# Patient Record
Sex: Female | Born: 1955 | Race: White | Hispanic: No | Marital: Married | Smoking: Former smoker
Health system: Southern US, Community
[De-identification: ages and names within clinical notes are randomized; demographics above are authoritative.]

## PROBLEM LIST (undated history)

## (undated) DIAGNOSIS — T7840XA Allergy, unspecified, initial encounter: Secondary | ICD-10-CM

## (undated) DIAGNOSIS — H269 Unspecified cataract: Secondary | ICD-10-CM

## (undated) DIAGNOSIS — H6990 Unspecified Eustachian tube disorder, unspecified ear: Secondary | ICD-10-CM

## (undated) DIAGNOSIS — H1010 Acute atopic conjunctivitis, unspecified eye: Secondary | ICD-10-CM

## (undated) DIAGNOSIS — H6980 Other specified disorders of Eustachian tube, unspecified ear: Secondary | ICD-10-CM

## (undated) HISTORY — DX: Other specified disorders of Eustachian tube, unspecified ear: H69.80

## (undated) HISTORY — PX: TONSILLECTOMY: SUR1361

## (undated) HISTORY — PX: POLYPECTOMY: SHX149

## (undated) HISTORY — PX: COLONOSCOPY: SHX174

## (undated) HISTORY — DX: Unspecified eustachian tube disorder, unspecified ear: H69.90

## (undated) HISTORY — PX: OTHER SURGICAL HISTORY: SHX169

## (undated) HISTORY — DX: Acute atopic conjunctivitis, unspecified eye: H10.10

## (undated) HISTORY — DX: Unspecified cataract: H26.9

## (undated) HISTORY — DX: Allergy, unspecified, initial encounter: T78.40XA

## (undated) HISTORY — PX: WISDOM TOOTH EXTRACTION: SHX21

## (undated) HISTORY — PX: BARTHOLIN GLAND CYST EXCISION: SHX565

---

## 1999-01-09 ENCOUNTER — Other Ambulatory Visit: Admission: RE | Admit: 1999-01-09 | Discharge: 1999-01-09 | Payer: Self-pay | Admitting: Obstetrics and Gynecology

## 2001-07-01 ENCOUNTER — Other Ambulatory Visit: Admission: RE | Admit: 2001-07-01 | Discharge: 2001-07-01 | Payer: Self-pay | Admitting: Obstetrics and Gynecology

## 2001-07-08 ENCOUNTER — Ambulatory Visit (HOSPITAL_COMMUNITY): Admission: RE | Admit: 2001-07-08 | Discharge: 2001-07-08 | Payer: Self-pay | Admitting: Obstetrics and Gynecology

## 2001-07-08 ENCOUNTER — Encounter: Payer: Self-pay | Admitting: Obstetrics and Gynecology

## 2002-07-04 ENCOUNTER — Other Ambulatory Visit: Admission: RE | Admit: 2002-07-04 | Discharge: 2002-07-04 | Payer: Self-pay | Admitting: Obstetrics and Gynecology

## 2002-08-04 ENCOUNTER — Encounter: Payer: Self-pay | Admitting: Obstetrics and Gynecology

## 2002-08-04 ENCOUNTER — Ambulatory Visit (HOSPITAL_COMMUNITY): Admission: RE | Admit: 2002-08-04 | Discharge: 2002-08-04 | Payer: Self-pay | Admitting: Obstetrics and Gynecology

## 2003-08-07 ENCOUNTER — Other Ambulatory Visit: Admission: RE | Admit: 2003-08-07 | Discharge: 2003-08-07 | Payer: Self-pay | Admitting: Obstetrics and Gynecology

## 2004-09-05 ENCOUNTER — Other Ambulatory Visit: Admission: RE | Admit: 2004-09-05 | Discharge: 2004-09-05 | Payer: Self-pay | Admitting: Obstetrics and Gynecology

## 2004-11-28 ENCOUNTER — Ambulatory Visit (HOSPITAL_COMMUNITY): Admission: RE | Admit: 2004-11-28 | Discharge: 2004-11-28 | Payer: Self-pay | Admitting: Obstetrics and Gynecology

## 2005-12-09 ENCOUNTER — Other Ambulatory Visit: Admission: RE | Admit: 2005-12-09 | Discharge: 2005-12-09 | Payer: Self-pay | Admitting: Obstetrics and Gynecology

## 2006-12-22 ENCOUNTER — Other Ambulatory Visit: Admission: RE | Admit: 2006-12-22 | Discharge: 2006-12-22 | Payer: Self-pay | Admitting: Obstetrics and Gynecology

## 2006-12-24 ENCOUNTER — Ambulatory Visit (HOSPITAL_COMMUNITY): Admission: RE | Admit: 2006-12-24 | Discharge: 2006-12-24 | Payer: Self-pay | Admitting: Obstetrics and Gynecology

## 2007-07-06 ENCOUNTER — Ambulatory Visit: Payer: Self-pay | Admitting: Family Medicine

## 2007-07-06 DIAGNOSIS — H6990 Unspecified Eustachian tube disorder, unspecified ear: Secondary | ICD-10-CM | POA: Insufficient documentation

## 2007-07-06 DIAGNOSIS — H6980 Other specified disorders of Eustachian tube, unspecified ear: Secondary | ICD-10-CM | POA: Insufficient documentation

## 2007-07-06 DIAGNOSIS — J309 Allergic rhinitis, unspecified: Secondary | ICD-10-CM | POA: Insufficient documentation

## 2007-08-10 ENCOUNTER — Telehealth (INDEPENDENT_AMBULATORY_CARE_PROVIDER_SITE_OTHER): Payer: Self-pay | Admitting: *Deleted

## 2007-08-23 ENCOUNTER — Telehealth (INDEPENDENT_AMBULATORY_CARE_PROVIDER_SITE_OTHER): Payer: Self-pay | Admitting: Family Medicine

## 2007-10-06 ENCOUNTER — Telehealth (INDEPENDENT_AMBULATORY_CARE_PROVIDER_SITE_OTHER): Payer: Self-pay | Admitting: *Deleted

## 2007-10-07 ENCOUNTER — Ambulatory Visit: Payer: Self-pay | Admitting: Family Medicine

## 2007-10-08 ENCOUNTER — Telehealth (INDEPENDENT_AMBULATORY_CARE_PROVIDER_SITE_OTHER): Payer: Self-pay | Admitting: Family Medicine

## 2007-12-24 ENCOUNTER — Ambulatory Visit: Payer: Self-pay | Admitting: Internal Medicine

## 2007-12-24 DIAGNOSIS — L2089 Other atopic dermatitis: Secondary | ICD-10-CM | POA: Insufficient documentation

## 2008-03-16 ENCOUNTER — Other Ambulatory Visit: Admission: RE | Admit: 2008-03-16 | Discharge: 2008-03-16 | Payer: Self-pay | Admitting: Obstetrics and Gynecology

## 2008-03-20 ENCOUNTER — Telehealth (INDEPENDENT_AMBULATORY_CARE_PROVIDER_SITE_OTHER): Payer: Self-pay | Admitting: *Deleted

## 2008-04-18 ENCOUNTER — Ambulatory Visit: Payer: Self-pay | Admitting: Internal Medicine

## 2008-04-28 ENCOUNTER — Ambulatory Visit (HOSPITAL_COMMUNITY): Admission: RE | Admit: 2008-04-28 | Discharge: 2008-04-28 | Payer: Self-pay | Admitting: Obstetrics and Gynecology

## 2008-05-19 ENCOUNTER — Ambulatory Visit: Payer: Self-pay | Admitting: Internal Medicine

## 2008-09-11 ENCOUNTER — Ambulatory Visit: Payer: Self-pay | Admitting: Family Medicine

## 2008-09-11 DIAGNOSIS — J069 Acute upper respiratory infection, unspecified: Secondary | ICD-10-CM | POA: Insufficient documentation

## 2008-12-06 ENCOUNTER — Ambulatory Visit: Payer: Self-pay | Admitting: Family Medicine

## 2009-03-19 ENCOUNTER — Other Ambulatory Visit: Admission: RE | Admit: 2009-03-19 | Discharge: 2009-03-19 | Payer: Self-pay | Admitting: Obstetrics and Gynecology

## 2009-05-10 ENCOUNTER — Ambulatory Visit (HOSPITAL_COMMUNITY): Admission: RE | Admit: 2009-05-10 | Discharge: 2009-05-10 | Payer: Self-pay | Admitting: Obstetrics and Gynecology

## 2009-06-06 ENCOUNTER — Telehealth (INDEPENDENT_AMBULATORY_CARE_PROVIDER_SITE_OTHER): Payer: Self-pay | Admitting: *Deleted

## 2009-06-06 ENCOUNTER — Ambulatory Visit: Payer: Self-pay | Admitting: Family Medicine

## 2009-06-06 DIAGNOSIS — H1045 Other chronic allergic conjunctivitis: Secondary | ICD-10-CM | POA: Insufficient documentation

## 2009-06-12 ENCOUNTER — Telehealth (INDEPENDENT_AMBULATORY_CARE_PROVIDER_SITE_OTHER): Payer: Self-pay | Admitting: *Deleted

## 2009-06-13 ENCOUNTER — Telehealth (INDEPENDENT_AMBULATORY_CARE_PROVIDER_SITE_OTHER): Payer: Self-pay | Admitting: *Deleted

## 2009-06-18 ENCOUNTER — Telehealth (INDEPENDENT_AMBULATORY_CARE_PROVIDER_SITE_OTHER): Payer: Self-pay | Admitting: *Deleted

## 2009-07-06 ENCOUNTER — Ambulatory Visit: Payer: Self-pay | Admitting: Internal Medicine

## 2009-07-06 LAB — CONVERTED CEMR LAB
Anti Nuclear Antibody(ANA): NEGATIVE
Basophils Relative: 0.9 % (ref 0.0–3.0)
Eosinophils Absolute: 0.1 10*3/uL (ref 0.0–0.7)
Hemoglobin: 14.1 g/dL (ref 12.0–15.0)
Lymphocytes Relative: 24.2 % (ref 12.0–46.0)
MCHC: 33.8 g/dL (ref 30.0–36.0)
Neutro Abs: 4.6 10*3/uL (ref 1.4–7.7)
RBC: 4.39 M/uL (ref 3.87–5.11)

## 2009-08-01 ENCOUNTER — Ambulatory Visit: Payer: Self-pay | Admitting: Internal Medicine

## 2010-01-30 ENCOUNTER — Encounter (INDEPENDENT_AMBULATORY_CARE_PROVIDER_SITE_OTHER): Payer: Self-pay | Admitting: *Deleted

## 2010-01-30 ENCOUNTER — Ambulatory Visit: Payer: Self-pay | Admitting: Family Medicine

## 2010-05-03 ENCOUNTER — Other Ambulatory Visit: Admission: RE | Admit: 2010-05-03 | Discharge: 2010-05-03 | Payer: Self-pay | Admitting: Obstetrics and Gynecology

## 2010-06-12 ENCOUNTER — Ambulatory Visit (HOSPITAL_COMMUNITY): Admission: RE | Admit: 2010-06-12 | Discharge: 2010-06-12 | Payer: Self-pay | Admitting: Obstetrics and Gynecology

## 2010-07-04 ENCOUNTER — Telehealth: Payer: Self-pay | Admitting: Internal Medicine

## 2010-10-20 ENCOUNTER — Encounter: Payer: Self-pay | Admitting: Obstetrics and Gynecology

## 2010-10-29 NOTE — Assessment & Plan Note (Signed)
Summary: eyes matted crusted,draining/alr   Vital Signs:  Patient profile:   55 year old female Weight:      149.4 pounds Temp:     98.5 degrees F o1 BP sitting:   120 / 76  (left arm)  Vitals Entered By: Doristine Devoid (Jan 30, 2010 2:23 PM) CC: both eyes itchy and redness along w/ drainage   History of Present Illness: 55 yo woman here today w/ itchy, red eyes.  had seen Allergist and was told that she is not allergic to anything.  sxs resolved over the winter but again appeared w/ the change in season.  sxs started again 5-6 weeks ago.  using Aquaphor around eyes.  using Cromolyn eye drops 4x/day and Visine allergy w/out relief.  eye drainage is thin and watery.  eyes feel gritty.  using Nasonex and Allegra.  Allergies: No Known Drug Allergies  Review of Systems      See HPI  Physical Exam  General:  alert and well-developed.   Eyes:  + conjunctival injxn and inflammation, clear watery discharge. Nose:  Mildly swollen turbinates with clear nasal discharge Mouth:  Oral mucosa and oropharynx without lesions or exudates.  Teeth in good repair.   Impression & Recommendations:  Problem # 1:  CONJUNCTIVITIS, ALLERGIC (ICD-372.14) Assessment Unchanged despite pt's (-) allergy testing pt's sxs recurred w/ change in season.  start Pataday and refer to Ophthalmology.  continue the Cromolyn, nasal steroids, Allegra.  will follow. Orders: Ophthalmology Referral (Ophthalmology)  Complete Medication List: 1)  Allegra 180 Mg Tabs (Fexofenadine hcl) .... Take one tablet daily 2)  Nasonex 50 Mcg/act Susp (Mometasone furoate) .... 2 sprays each nostril  once daily 3)  Pataday 0.2 % Soln (Olopatadine hcl) .Marland Kitchen.. 1 drop each eye daily.  1 bottle  Patient Instructions: 1)  Someone will call you with your eye appt 2)  Use the Pataday as directed 3)  Continue the Cromolyn drops 4)  Ice your eyes using an eye gel mask 5)  Call with any questions or concerns 6)  Hang in  there!! Prescriptions: PATADAY 0.2 % SOLN (OLOPATADINE HCL) 1 drop each eye daily.  1 bottle  #1 x 3   Entered and Authorized by:   Neena Rhymes MD   Signed by:   Neena Rhymes MD on 01/30/2010   Method used:   Electronically to        [facility] [address] [id number]* (retail)       [address]       [place], [place]  [address]       Ph: [phone]       Fax: [phone]   RxID:   [id number]

## 2010-10-29 NOTE — Progress Notes (Signed)
Summary: refill  Phone Note Refill Request Message from:  Fax from Pharmacy on July 04, 2010 8:21 AM  Refills Requested: Medication #1:  PATADAY 0.2 % SOLN 1 drop each eye daily.  1 bottle. [facility] [address] - fax [phone]  Initial call taken by: Okey Regal [place],  July 04, 2010 8:22 AM  Follow-up for Phone Call        ok for 1 vial, 3 refills Follow-up by: Neena Rhymes MD,  July 04, 2010 9:10 AM    Prescriptions: PATADAY 0.2 % SOLN (OLOPATADINE HCL) 1 drop each eye daily.  1 bottle  #1 x 3   Entered by:   Lucious Groves CMA   Authorized by:   Nolon Rod. Kaitrin Seybold MD   Signed by:   Lucious Groves CMA on 07/04/2010   Method used:   Electronically to        [id number]* (retail)       [address]       [place], [place]  [address]       Ph: [phone]       Fax: [phone]   RxID:   [id number]

## 2010-10-29 NOTE — Letter (Signed)
Summary: [facility]  [address] [place], [place] [address]   Phone: [phone]  Fax: [phone]    To: Dagoberto Reef [facility] Court  From: Magdalen Spatz [facility]  01/30/2010  Patient's Name: Felicia Ball Date of Birth: 1956/01/15   The above patient is physically or mentally unable to perform jury duty.

## 2011-01-14 ENCOUNTER — Telehealth: Payer: Self-pay | Admitting: Family Medicine

## 2011-01-14 NOTE — Telephone Encounter (Signed)
Patient needs nose spray called into [facility] on corner of [address], [place]---says Nasonex costs $55.00 so she would like Flonase to be called in

## 2011-01-15 MED ORDER — FLUTICASONE PROPIONATE 50 MCG/ACT NA SUSP: 2.0000 | Freq: Every day | NASAL | Status: DC

## 2011-01-15 NOTE — Telephone Encounter (Signed)
Left message on voicemail to call the office and schedule CPX, refill being sent.

## 2011-01-15 NOTE — Telephone Encounter (Signed)
Per EMR records (mrn [id number]) it looks as if this pt transferred to [facility]. Last seen 5/11. Please advise.

## 2011-01-15 NOTE — Telephone Encounter (Signed)
Ok for [employer]...will need yearly CPE scheduled if it's coming due

## 2011-02-18 ENCOUNTER — Other Ambulatory Visit: Payer: Self-pay | Admitting: *Deleted

## 2011-02-18 MED ORDER — OLOPATADINE HCL 0.2 % OP SOLN: 1.0000 [drp] | Freq: Every day | OPHTHALMIC | Status: DC

## 2011-02-18 NOTE — Telephone Encounter (Deleted)
PATADAY 0.2 % SOLN Last filled 07-04-10 #1 3,last 01-30-10

## 2011-02-19 NOTE — Telephone Encounter (Signed)
Med refilled.

## 2011-03-17 ENCOUNTER — Encounter: Payer: Self-pay | Admitting: Family Medicine

## 2011-03-18 ENCOUNTER — Encounter: Payer: Self-pay | Admitting: Family Medicine

## 2011-03-26 ENCOUNTER — Encounter: Payer: Self-pay | Admitting: Family Medicine

## 2011-03-27 ENCOUNTER — Encounter: Payer: Self-pay | Admitting: Internal Medicine

## 2011-03-27 ENCOUNTER — Other Ambulatory Visit (HOSPITAL_COMMUNITY)
Admission: RE | Admit: 2011-03-27 | Discharge: 2011-03-27 | Disposition: A | Discharge status: Home / Self Care | Payer: BC Managed Care – PPO | Source: Ambulatory Visit | Attending: Family Medicine | Admitting: Family Medicine

## 2011-03-27 ENCOUNTER — Encounter: Payer: Self-pay | Admitting: Family Medicine

## 2011-03-27 ENCOUNTER — Ambulatory Visit (INDEPENDENT_AMBULATORY_CARE_PROVIDER_SITE_OTHER): Payer: BC Managed Care – PPO | Admitting: Family Medicine

## 2011-03-27 DIAGNOSIS — Z0000 Encounter for general adult medical examination without abnormal findings: Secondary | ICD-10-CM | POA: Insufficient documentation

## 2011-03-27 DIAGNOSIS — Z01419 Encounter for gynecological examination (general) (routine) without abnormal findings: Secondary | ICD-10-CM

## 2011-03-27 DIAGNOSIS — Z124 Encounter for screening for malignant neoplasm of cervix: Secondary | ICD-10-CM | POA: Insufficient documentation

## 2011-03-27 DIAGNOSIS — Z1211 Encounter for screening for malignant neoplasm of colon: Secondary | ICD-10-CM

## 2011-03-27 LAB — CBC WITH DIFFERENTIAL/PLATELET
Basophils Relative: 0.7 % (ref 0.0–3.0)
Eosinophils Relative: 1.6 % (ref 0.0–5.0)
HCT: 41 % (ref 36.0–46.0)
Hemoglobin: 13.8 g/dL (ref 12.0–15.0)
Lymphs Abs: 1.3 10*3/uL (ref 0.7–4.0)
Monocytes Relative: 5.9 % (ref 3.0–12.0)
Neutro Abs: 5.2 10*3/uL (ref 1.4–7.7)
WBC: 7.1 10*3/uL (ref 4.5–10.5)

## 2011-03-27 LAB — HEPATIC FUNCTION PANEL
Albumin: 4.3 g/dL (ref 3.5–5.2)
Total Protein: 7.1 g/dL (ref 6.0–8.3)

## 2011-03-27 LAB — LIPID PANEL
Cholesterol: 224 mg/dL — ABNORMAL HIGH (ref 0–200)
Triglycerides: 59 mg/dL (ref 0.0–149.0)

## 2011-03-27 LAB — BASIC METABOLIC PANEL
CO2: 25 mEq/L (ref 19–32)
Calcium: 9.1 mg/dL (ref 8.4–10.5)
Creatinine, Ser: 0.8 mg/dL (ref 0.4–1.2)
Glucose, Bld: 85 mg/dL (ref 70–99)

## 2011-03-27 LAB — LDL CHOLESTEROL, DIRECT: Direct LDL: 115.4 mg/dL

## 2011-03-27 NOTE — Assessment & Plan Note (Signed)
Pap collected. 

## 2011-03-27 NOTE — Patient Instructions (Signed)
Follow up in 1 year or as needed Keep up the good work!  Your exam looks great! We'll notify you of your lab results Call with any questions or concerns Have a great summer!

## 2011-03-27 NOTE — Progress Notes (Signed)
  Subjective:    Patient ID: Felicia Ball, female    DOB: Mar 13, 1956, 55 y.o.   MRN: [id number]  HPI CPE- no concerns today.  UTD on mammogram.  Has never had colonoscopy.   Review of Systems Patient reports no vision/ hearing changes, adenopathy,fever, weight change,  persistant/recurrent hoarseness , swallowing issues, chest pain, palpitations, edema, persistant/recurrent cough, hemoptysis, dyspnea (rest/exertional/paroxysmal nocturnal), gastrointestinal bleeding (melena, rectal bleeding), abdominal pain, significant heartburn, bowel changes, GU symptoms (dysuria, hematuria, incontinence), Gyn symptoms (abnormal  bleeding, pain),  syncope, focal weakness, memory loss, numbness & tingling, skin/hair/nail changes, abnormal bruising or bleeding, anxiety, or depression.     Objective:   Physical Exam  General Appearance:    Alert, cooperative, no distress, appears stated age  Head:    Normocephalic, without obvious abnormality, atraumatic  Eyes:    PERRL, conjunctiva/corneas clear, EOM's intact, fundi    benign, both eyes  Ears:    Normal TM's and external ear canals, both ears  Nose:   Nares normal, septum midline, mucosa normal, no drainage    or sinus tenderness  Throat:   Lips, mucosa, and tongue normal; teeth and gums normal  Neck:   Supple, symmetrical, trachea midline, no adenopathy;    Thyroid: no enlargement/tenderness/nodules  Back:     Symmetric, no curvature, ROM normal, no CVA tenderness  Lungs:     Clear to auscultation bilaterally, respirations unlabored  Chest Wall:    No tenderness or deformity   Heart:    Regular rate and rhythm, S1 and S2 normal, no murmur, rub   or gallop  Breast Exam:    No tenderness, masses, or nipple abnormality  Abdomen:     Soft, non-tender, bowel sounds active all four quadrants,    no masses, no organomegaly  Genitalia:    External genitalia normal, cervix normal in appearance, no CMT, uterus in normal size and position, adnexa w/out  mass or tenderness, mucosa pink and moist, no lesions or discharge present  Rectal:    Normal external appearance  Extremities:   Extremities normal, atraumatic, no cyanosis or edema  Pulses:   2+ and symmetric all extremities  Skin:   Skin color, texture, turgor normal, no rashes or lesions  Lymph nodes:   Cervical, supraclavicular, and axillary nodes normal  Neurologic:   CNII-XII intact, normal strength, sensation and reflexes    throughout          Assessment & Plan:

## 2011-03-27 NOTE — Assessment & Plan Note (Signed)
Pt's PE WNL.  EKG as baseline.  Refer for colonoscopy.  UTD on mammo.  Anticipatory guidance provided.

## 2011-03-28 ENCOUNTER — Encounter: Payer: Self-pay | Admitting: *Deleted

## 2011-03-29 LAB — VITAMIN D 1,25 DIHYDROXY
Vitamin D 1, 25 (OH)2 Total: 59 pg/mL (ref 18–72)
Vitamin D2 1, 25 (OH)2: <8 pg/mL

## 2011-03-31 ENCOUNTER — Encounter: Payer: Self-pay | Admitting: *Deleted

## 2011-04-01 ENCOUNTER — Other Ambulatory Visit: Payer: Self-pay | Admitting: Internal Medicine

## 2011-04-01 NOTE — Telephone Encounter (Signed)
Please send to Dr Beverely Low

## 2011-04-01 NOTE — Telephone Encounter (Signed)
Refill sent.

## 2011-04-23 ENCOUNTER — Other Ambulatory Visit: Payer: BC Managed Care – PPO | Admitting: Internal Medicine

## 2011-04-28 ENCOUNTER — Other Ambulatory Visit: Payer: Self-pay | Admitting: Internal Medicine

## 2011-04-28 MED ORDER — FLUTICASONE PROPIONATE 50 MCG/ACT NA SUSP: 2.0000 | Freq: Every day | NASAL | Status: DC

## 2011-04-28 NOTE — Telephone Encounter (Signed)
Ok to refill x 3 

## 2011-07-02 ENCOUNTER — Other Ambulatory Visit: Payer: Self-pay | Admitting: Family Medicine

## 2011-07-02 DIAGNOSIS — Z1231 Encounter for screening mammogram for malignant neoplasm of breast: Secondary | ICD-10-CM

## 2011-07-14 ENCOUNTER — Ambulatory Visit (HOSPITAL_COMMUNITY): Payer: BC Managed Care – PPO

## 2011-07-29 ENCOUNTER — Ambulatory Visit (HOSPITAL_COMMUNITY)
Admission: RE | Admit: 2011-07-29 | Discharge: 2011-07-29 | Disposition: A | Discharge status: Home / Self Care | Payer: BC Managed Care – PPO | Source: Ambulatory Visit | Attending: Family Medicine | Admitting: Family Medicine

## 2011-07-29 DIAGNOSIS — Z1231 Encounter for screening mammogram for malignant neoplasm of breast: Secondary | ICD-10-CM | POA: Insufficient documentation

## 2011-08-11 ENCOUNTER — Other Ambulatory Visit: Payer: Self-pay | Admitting: Internal Medicine

## 2011-08-11 MED ORDER — NORETHIN ACE-ETH ESTRAD-FE 1-20 MG-MCG PO TABS: 1.0000 | ORAL_TABLET | Freq: Every day | ORAL | Status: DC

## 2011-08-11 NOTE — Telephone Encounter (Signed)
rx sent to pharmacy by e-script  

## 2011-09-01 ENCOUNTER — Encounter: Payer: Self-pay | Admitting: Family Medicine

## 2011-09-01 ENCOUNTER — Ambulatory Visit (INDEPENDENT_AMBULATORY_CARE_PROVIDER_SITE_OTHER): Payer: BC Managed Care – PPO | Admitting: Family Medicine

## 2011-09-01 VITALS — BP 115/65 | HR 80 | Temp 98.0°F | Ht 62.0 in | Wt 155.8 lb

## 2011-09-01 DIAGNOSIS — J329 Chronic sinusitis, unspecified: Secondary | ICD-10-CM | POA: Insufficient documentation

## 2011-09-01 MED ORDER — CLARITHROMYCIN ER 500 MG PO TB24: 1000.0000 mg | ORAL_TABLET | Freq: Every day | ORAL | Status: DC

## 2011-09-01 NOTE — Patient Instructions (Signed)
Take the Biaxin as directed for the sinus infection 2 tabs at the same time, w/ food Drink plenty of fluids Mucinex to thin your congestion Hang in there! Happy Holidays!

## 2011-09-01 NOTE — Progress Notes (Signed)
  Subjective:    Patient ID: Felicia Ball, female    DOB: 28-Feb-1956, 55 y.o.   MRN: [id number]  HPI ? Sinus infxn- sxs started as a 'bad chest cold' 1 month ago.  Hasn't felt right since.  Having green, bloody nasal drainage.  + facial pain.  Ears will pop and 'crack'.  No fevers.  Minimal cough.  + sick contacts.   Review of Systems For ROS see HPI     Objective:   Physical Exam  Constitutional: She appears well-developed and well-nourished. No distress.  HENT:  Head: Normocephalic and atraumatic.  Right Ear: Tympanic membrane normal.  Left Ear: Tympanic membrane normal.  Nose: Mucosal edema and rhinorrhea present. Right sinus exhibits maxillary sinus tenderness and frontal sinus tenderness. Left sinus exhibits maxillary sinus tenderness and frontal sinus tenderness.  Mouth/Throat: Uvula is midline and mucous membranes are normal. Posterior oropharyngeal erythema present. No oropharyngeal exudate.  Eyes: Conjunctivae and EOM are normal. Pupils are equal, round, and reactive to light.  Neck: Normal range of motion. Neck supple.  Cardiovascular: Normal rate, regular rhythm and normal heart sounds.   Pulmonary/Chest: Effort normal and breath sounds normal. No respiratory distress. She has no wheezes.  Lymphadenopathy:    She has no cervical adenopathy.          Assessment & Plan:

## 2011-09-01 NOTE — Assessment & Plan Note (Signed)
Pt's hx and PE consistent w/ infxn.  Start abx.  Reviewed supportive care and red flags that should prompt return.  Pt expressed understanding and is in agreement w/ plan.  

## 2011-11-04 ENCOUNTER — Other Ambulatory Visit: Payer: Self-pay | Admitting: Internal Medicine

## 2011-11-04 MED ORDER — FLUTICASONE PROPIONATE 50 MCG/ACT NA SUSP: 2.0000 | Freq: Every day | NASAL | Status: DC

## 2011-11-04 NOTE — Telephone Encounter (Signed)
Will send to PCP 

## 2011-11-04 NOTE — Telephone Encounter (Signed)
rx sent to pharmacy by e-script  

## 2011-11-04 NOTE — Telephone Encounter (Signed)
Ok for 12 months of refills

## 2011-11-04 NOTE — Telephone Encounter (Signed)
Refill request fluticasone . Pt has not been seen within a year. OK to refill?

## 2011-12-24 ENCOUNTER — Ambulatory Visit (INDEPENDENT_AMBULATORY_CARE_PROVIDER_SITE_OTHER): Payer: BC Managed Care – PPO | Admitting: Internal Medicine

## 2011-12-24 ENCOUNTER — Encounter: Payer: Self-pay | Admitting: Internal Medicine

## 2011-12-24 VITALS — BP 118/80 | HR 76 | Temp 98.4°F | Wt 149.8 lb

## 2011-12-24 DIAGNOSIS — J302 Other seasonal allergic rhinitis: Secondary | ICD-10-CM

## 2011-12-24 DIAGNOSIS — J0100 Acute maxillary sinusitis, unspecified: Secondary | ICD-10-CM

## 2011-12-24 DIAGNOSIS — J309 Allergic rhinitis, unspecified: Secondary | ICD-10-CM

## 2011-12-24 MED ORDER — CEFUROXIME AXETIL 500 MG PO TABS: 500.0000 mg | ORAL_TABLET | Freq: Two times a day (BID) | ORAL | Status: AC

## 2011-12-24 MED ORDER — AZELASTINE-FLUTICASONE 137-50 MCG/ACT NA SUSP: 1.0000 | Freq: Two times a day (BID) | NASAL | Status: DC

## 2011-12-24 NOTE — Patient Instructions (Signed)
Plain Mucinex for thick secretions ;force NON dairy fluids . Use a Neti pot daily as needed for sinus congestion. Nasal cleansing in the shower as discussed. Make sure that all residual soap is removed to prevent irritation. Dymista  1 spray in each nostril twice a day as needed. Use the "crossover" technique as discussed

## 2011-12-24 NOTE — Progress Notes (Signed)
  Subjective:    Patient ID: Felicia Ball, female    DOB: 1956-03-24, 56 y.o.   MRN: [id number]  HPI Respiratory tract infection Onset/symptoms:2 weeks ago as allergy flare Exposures (illness/environmental/extrinsic):tres budding Progression of symptoms:to head congestion Treatments/response:Neti pot, allergy meds, Sudafed Present symptoms: Fever/chills/sweats:chills Frontal headache:no Facial pain:yes Nasal purulence: yes Sore throat:no Dental pain:no Lymphadenopathy: no Wheezing/shortness of breath:some DOE Cough/sputum/hemoptysis:dry  Associated extrinsic/allergic symptoms:itchy eyes/ sneezing:yes Past medical history: Seasonal allergies: yes/asthma:no Smoking history:quit 1984           Review of Systems     Objective:   Physical Exam General appearance:good health ;well nourished; no acute distress or increased work of breathing is present.  No  lymphadenopathy about the head, neck, or axilla noted.   Eyes: No conjunctival inflammation or lid edema is present. There is no scleral icterus.  Ears:  External ear exam shows no significant lesions or deformities.  Otoscopic examination reveals clear canals, tympanic membranes are intact bilaterally without bulging, retraction, inflammation or discharge.  Nose:  External nasal examination shows no deformity or inflammation. Nasal mucosa are dry  without lesions or exudates. Slight R  septal  Deviation.Minimal  obstruction to airflow.   Oral exam: Dental hygiene is good; lips and gums are healthy appearing.There is no oropharyngeal erythema or exudate noted.      Heart:  Normal rate and regular rhythm. S1 and S2 normal without gallop, murmur, click, rub or other extra sounds.   Lungs:Chest clear to auscultation; no wheezes, rhonchi,rales ,or rubs present.No increased work of breathing.    Extremities:  No cyanosis, edema, or clubbing  noted    Skin: Warm & dry .          Assessment & Plan:  #1  rhinosinusitis, maxillary  #2 seasonal rhinoconjunctivitis  Plan: See orders and recommendations

## 2012-01-20 ENCOUNTER — Telehealth: Payer: Self-pay | Admitting: *Deleted

## 2012-01-20 NOTE — Telephone Encounter (Signed)
Prior Auth approved 12-21-11 until 01-19-13, Pharmacy notified via fax.Awaiting approval letter to scan to chart.

## 2012-03-10 ENCOUNTER — Other Ambulatory Visit: Payer: Self-pay | Admitting: Internal Medicine

## 2012-03-10 NOTE — Telephone Encounter (Signed)
Pt has not been seen by you within a year. OK to refill? 

## 2012-03-11 NOTE — Telephone Encounter (Signed)
Per Dr Alwyn Ren

## 2012-03-15 ENCOUNTER — Telehealth: Payer: Self-pay | Admitting: Internal Medicine

## 2012-03-15 NOTE — Telephone Encounter (Signed)
Fine w/me-

## 2012-03-15 NOTE — Telephone Encounter (Signed)
Yes please

## 2012-03-15 NOTE — Telephone Encounter (Signed)
Patient states she always want to see Beverely Low, most history is wt/[facility], if ok I can change PCP, please review and advise

## 2012-05-14 ENCOUNTER — Encounter: Payer: BC Managed Care – PPO | Admitting: Family Medicine

## 2012-05-31 ENCOUNTER — Other Ambulatory Visit: Payer: Self-pay | Admitting: Internal Medicine

## 2012-06-01 NOTE — Telephone Encounter (Signed)
rx sent to pharmacy by e-script  

## 2012-06-09 ENCOUNTER — Ambulatory Visit (INDEPENDENT_AMBULATORY_CARE_PROVIDER_SITE_OTHER): Payer: BC Managed Care – PPO | Admitting: Family Medicine

## 2012-06-09 ENCOUNTER — Other Ambulatory Visit (HOSPITAL_COMMUNITY)
Admission: RE | Admit: 2012-06-09 | Discharge: 2012-06-09 | Disposition: A | Discharge status: Home / Self Care | Payer: BC Managed Care – PPO | Source: Ambulatory Visit | Attending: Family Medicine | Admitting: Family Medicine

## 2012-06-09 ENCOUNTER — Encounter: Payer: Self-pay | Admitting: Family Medicine

## 2012-06-09 VITALS — BP 122/80 | HR 98 | Temp 98.4°F | Ht 61.25 in | Wt 145.4 lb

## 2012-06-09 DIAGNOSIS — Z01419 Encounter for gynecological examination (general) (routine) without abnormal findings: Secondary | ICD-10-CM | POA: Insufficient documentation

## 2012-06-09 DIAGNOSIS — Z13220 Encounter for screening for lipoid disorders: Secondary | ICD-10-CM

## 2012-06-09 DIAGNOSIS — Z0000 Encounter for general adult medical examination without abnormal findings: Secondary | ICD-10-CM

## 2012-06-09 DIAGNOSIS — Z1211 Encounter for screening for malignant neoplasm of colon: Secondary | ICD-10-CM

## 2012-06-09 DIAGNOSIS — Z124 Encounter for screening for malignant neoplasm of cervix: Secondary | ICD-10-CM

## 2012-06-09 LAB — CBC WITH DIFFERENTIAL/PLATELET
Eosinophils Relative: 0.5 % (ref 0.0–5.0)
HCT: 42.4 % (ref 36.0–46.0)
Hemoglobin: 13.9 g/dL (ref 12.0–15.0)
Lymphocytes Relative: 9.2 % — ABNORMAL LOW (ref 12.0–46.0)
Lymphs Abs: 0.9 10*3/uL (ref 0.7–4.0)
Monocytes Relative: 4.3 % (ref 3.0–12.0)
Platelets: 206 10*3/uL (ref 150.0–400.0)
WBC: 9.4 10*3/uL (ref 4.5–10.5)

## 2012-06-09 NOTE — Progress Notes (Signed)
  Subjective:    Patient ID: Felicia Ball, female    DOB: 1956/08/02, 56 y.o.   MRN: [id number]  HPI CPE- no concerns.  UTD on mammo.  Has never had colonoscopy.  Open to idea of colonoscopy.   Review of Systems Patient reports no vision/ hearing changes, adenopathy,fever, weight change,  persistant/recurrent hoarseness , swallowing issues, chest pain, palpitations, edema, persistant/recurrent cough, hemoptysis, dyspnea (rest/exertional/paroxysmal nocturnal), gastrointestinal bleeding (melena, rectal bleeding), abdominal pain, significant heartburn, bowel changes, GU symptoms (dysuria, hematuria, incontinence), Gyn symptoms (abnormal  bleeding, pain),  syncope, focal weakness, memory loss, numbness & tingling, skin/hair/nail changes, abnormal bruising or bleeding, anxiety, or depression.     Objective:   Physical Exam  General Appearance:    Alert, cooperative, no distress, appears stated age  Head:    Normocephalic, without obvious abnormality, atraumatic  Eyes:    PERRL, conjunctiva/corneas clear, EOM's intact, fundi    benign, both eyes  Ears:    Normal TM's and external ear canals, both ears  Nose:   Nares normal, septum midline, mucosa normal, no drainage    or sinus tenderness  Throat:   Lips, mucosa, and tongue normal; teeth and gums normal  Neck:   Supple, symmetrical, trachea midline, no adenopathy;    Thyroid: no enlargement/tenderness/nodules  Back:     Symmetric, no curvature, ROM normal, no CVA tenderness  Lungs:     Clear to auscultation bilaterally, respirations unlabored  Chest Wall:    No tenderness or deformity   Heart:    Regular rate and rhythm, S1 and S2 normal, no murmur, rub   or gallop  Breast Exam:    No tenderness, masses, or nipple abnormality  Abdomen:     Soft, non-tender, bowel sounds active all four quadrants,    no masses, no organomegaly  Genitalia:    External genitalia normal, cervix normal in appearance, no CMT, uterus in normal size and  position, adnexa w/out mass or tenderness, mucosa pink and moist, no lesions or discharge present  Rectal:    Normal external appearance  Extremities:   Extremities normal, atraumatic, no cyanosis or edema  Pulses:   2+ and symmetric all extremities  Skin:   Skin color, texture, turgor normal, no rashes or lesions  Lymph nodes:   Cervical, supraclavicular, and axillary nodes normal  Neurologic:   CNII-XII intact, normal strength, sensation and reflexes    throughout          Assessment & Plan:

## 2012-06-09 NOTE — Assessment & Plan Note (Signed)
Pt's PE WNL.  UTD on mammo.  Needs referral for colonoscopy.  Check labs.  Anticipatory guidance provided.

## 2012-06-09 NOTE — Assessment & Plan Note (Signed)
Pap collected. 

## 2012-06-09 NOTE — Patient Instructions (Addendum)
Follow up in 1 year or as needed You look great! We'll call you with your GI appt to discuss colonoscopy We'll notify you of your lab results Call with any questions or concerns Happy early birthday!!!

## 2012-06-10 LAB — LIPID PANEL
Total CHOL/HDL Ratio: 2
Triglycerides: 60 mg/dL (ref 0.0–149.0)

## 2012-06-10 LAB — BASIC METABOLIC PANEL
CO2: 22 mEq/L (ref 19–32)
Calcium: 9.7 mg/dL (ref 8.4–10.5)
Chloride: 103 mEq/L (ref 96–112)
Glucose, Bld: 85 mg/dL (ref 70–99)
Sodium: 136 mEq/L (ref 135–145)

## 2012-06-10 LAB — HEPATIC FUNCTION PANEL
ALT: 19 U/L (ref 0–35)
AST: 40 U/L — ABNORMAL HIGH (ref 0–37)
Albumin: 4.4 g/dL (ref 3.5–5.2)
Alkaline Phosphatase: 40 U/L (ref 39–117)
Bilirubin, Direct: 0.2 mg/dL (ref 0.0–0.3)
Total Protein: 7.8 g/dL (ref 6.0–8.3)

## 2012-06-11 LAB — LDL CHOLESTEROL, DIRECT: Direct LDL: 103.4 mg/dL

## 2012-06-13 LAB — VITAMIN D 1,25 DIHYDROXY
Vitamin D 1, 25 (OH)2 Total: 56 pg/mL (ref 18–72)
Vitamin D2 1, 25 (OH)2: <8 pg/mL
Vitamin D3 1, 25 (OH)2: 56 pg/mL

## 2012-06-15 ENCOUNTER — Encounter: Payer: Self-pay | Admitting: Family Medicine

## 2012-06-28 ENCOUNTER — Encounter: Payer: Self-pay | Admitting: Family Medicine

## 2012-06-30 ENCOUNTER — Encounter: Payer: BC Managed Care – PPO | Admitting: Family Medicine

## 2012-07-05 ENCOUNTER — Other Ambulatory Visit: Payer: Self-pay | Admitting: Family Medicine

## 2012-07-05 MED ORDER — NORETHINDRONE ACET-ETHINYL EST 1-20 MG-MCG PO TABS: 1.0000 | ORAL_TABLET | Freq: Every day | ORAL | Status: DC

## 2012-07-05 NOTE — Telephone Encounter (Signed)
refill microgestin 1/20 fe tabs 28 green #28 take 1-tablet by mouth every day -- last fill 9.11.13, last ov V72.31 9.11.13

## 2012-07-05 NOTE — Telephone Encounter (Signed)
rx sent to pharmacy by e-script  

## 2012-07-09 ENCOUNTER — Encounter: Payer: Self-pay | Admitting: Internal Medicine

## 2012-07-09 ENCOUNTER — Other Ambulatory Visit: Payer: BC Managed Care – PPO

## 2012-07-09 DIAGNOSIS — D729 Disorder of white blood cells, unspecified: Secondary | ICD-10-CM

## 2012-07-09 LAB — CBC WITH DIFFERENTIAL/PLATELET
Basophils Absolute: 0.1 10*3/uL (ref 0.0–0.1)
Basophils Relative: 1 % (ref 0–1)
Lymphocytes Relative: 22 % (ref 12–46)
MCHC: 33.7 g/dL (ref 30.0–36.0)
Neutro Abs: 5 10*3/uL (ref 1.7–7.7)
Platelets: 264 10*3/uL (ref 150–400)
RDW: 13.2 % (ref 11.5–15.5)
WBC: 7.7 10*3/uL (ref 4.0–10.5)

## 2012-08-11 ENCOUNTER — Other Ambulatory Visit: Payer: Self-pay | Admitting: Family Medicine

## 2012-08-11 MED ORDER — OLOPATADINE HCL 0.2 % OP SOLN: OPHTHALMIC | Status: DC

## 2012-08-11 NOTE — Telephone Encounter (Signed)
Rx sent 

## 2012-08-11 NOTE — Telephone Encounter (Signed)
refill PATADAY 0.2 % OPHTH SOLUTION 2ML--INSTILL 1 DROP IN AFFECTED EYE(S) DAILY ---LAST fill 10.10.13--last ov/wt--labs 9.11.13 V72.31

## 2012-09-13 ENCOUNTER — Ambulatory Visit (INDEPENDENT_AMBULATORY_CARE_PROVIDER_SITE_OTHER): Payer: BC Managed Care – PPO | Admitting: Family Medicine

## 2012-09-13 VITALS — BP 118/76 | HR 78 | Temp 99.0°F | Wt 151.0 lb

## 2012-09-13 DIAGNOSIS — J329 Chronic sinusitis, unspecified: Secondary | ICD-10-CM

## 2012-09-13 MED ORDER — CLARITHROMYCIN ER 500 MG PO TB24: 1000.0000 mg | ORAL_TABLET | Freq: Every day | ORAL | Status: AC

## 2012-09-13 NOTE — Assessment & Plan Note (Signed)
Pt's sxs and PE consistent w/ infxn.  Start abx.  Reviewed supportive care and red flags that should prompt return.  Pt expressed understanding and is in agreement w/ plan.  

## 2012-09-13 NOTE — Progress Notes (Signed)
  Subjective:    Patient ID: Felicia Ball, female    DOB: [birth year], 56 y.o.   MRN: [id number]  HPI URI- sxs started Thursday night.  + sick contacts.  'i feel like i'm going to explode'.  + ear pain L>R, fullness, facial pain/pressure.  Discolored nasal drainage.  Sore throat.  Low grade fevers.  Minimal cough.   Review of Systems For ROS see HPI     Objective:   Physical Exam  Vitals reviewed. Constitutional: She appears well-developed and well-nourished. No distress.  HENT:  Head: Normocephalic and atraumatic.  Right Ear: Tympanic membrane normal.  Left Ear: Tympanic membrane normal.  Nose: Mucosal edema and rhinorrhea present. Right sinus exhibits maxillary sinus tenderness and frontal sinus tenderness. Left sinus exhibits maxillary sinus tenderness and frontal sinus tenderness.  Mouth/Throat: Uvula is midline and mucous membranes are normal. Posterior oropharyngeal erythema present. No oropharyngeal exudate.  Eyes: Conjunctivae normal and EOM are normal. Pupils are equal, round, and reactive to light.  Neck: Normal range of motion. Neck supple.  Cardiovascular: Normal rate, regular rhythm and normal heart sounds.   Pulmonary/Chest: Effort normal and breath sounds normal. No respiratory distress. She has no wheezes.  Lymphadenopathy:    She has no cervical adenopathy.          Assessment & Plan:

## 2012-09-13 NOTE — Patient Instructions (Addendum)
This is a sinus infection Start the Biaxin- 2 pills at the same time daily x10 days.  Take w/ food Drink plenty of fluids Mucinex to thin your congestion REST! Hang in there! Happy Holidays!!!

## 2012-09-17 ENCOUNTER — Telehealth: Payer: Self-pay | Admitting: *Deleted

## 2012-09-17 ENCOUNTER — Telehealth: Payer: Self-pay | Admitting: Family Medicine

## 2012-09-17 MED ORDER — SULFAMETHOXAZOLE-TRIMETHOPRIM 800-160 MG PO TABS: 1.0000 | ORAL_TABLET | Freq: Two times a day (BID) | ORAL | Status: DC

## 2012-09-17 NOTE — Telephone Encounter (Signed)
Caller: Amarionna/Patient; Phone: [phone]; Reason for Call: Patient calling in follow up to sinus infection call.  States no better while taking Biaxin x 5 days, and continues to run fever.  Per Epic, patient had called AM 09/17/12; per Dr.  Beverely Low, Rx switched to Septra DS, 1 tab po BID x 7 days, take with food.  Rx called in to [facility] [phone].

## 2012-09-17 NOTE — Telephone Encounter (Signed)
Yes, she should switch to Bactrim DS as indicated in prior phone note

## 2012-09-17 NOTE — Telephone Encounter (Signed)
Should switch to Septra DS- 1 tab po BID x10 days, take w/ food.

## 2012-09-17 NOTE — Telephone Encounter (Signed)
Felicia Ball 09/17/2012 2:21 PM Signed  Caller: Azriel/Patient; Phone: [phone]; Reason for Call: Patient calling in follow up to sinus infection call. States no better while taking Biaxin x 5 days, and continues to run fever. Per Epic, patient had called AM 09/17/12; per Dr. Beverely Low, Rx switched to Septra DS, 1 tab po BID x 7 days, take with food. Rx called in to [facility] [phone].

## 2012-09-17 NOTE — Telephone Encounter (Signed)
Pt seen on 09-13-12 for sinus infection. Pt states that she has been on med for 5 days now and still feels no better. Pt would like to know what else she can do now.Please advise

## 2012-09-24 ENCOUNTER — Encounter: Payer: BC Managed Care – PPO | Admitting: Internal Medicine

## 2012-10-11 ENCOUNTER — Other Ambulatory Visit: Payer: BC Managed Care – PPO

## 2012-10-15 ENCOUNTER — Other Ambulatory Visit: Payer: Self-pay | Admitting: Family Medicine

## 2012-10-15 DIAGNOSIS — Z1231 Encounter for screening mammogram for malignant neoplasm of breast: Secondary | ICD-10-CM

## 2012-10-21 ENCOUNTER — Ambulatory Visit (HOSPITAL_COMMUNITY): Payer: BC Managed Care – PPO

## 2012-11-02 ENCOUNTER — Ambulatory Visit (HOSPITAL_COMMUNITY): Payer: BC Managed Care – PPO

## 2012-11-04 ENCOUNTER — Other Ambulatory Visit: Payer: Self-pay | Admitting: Family Medicine

## 2012-11-04 NOTE — Telephone Encounter (Signed)
Refills x 2  1-PATADAY 0.2% OPHTH Solution 2.5ML #2.5, Instill 1 Drop in both eyes every day, last fill 12.30.13  2-fluticasone Nasal Sp (120NH) #16, use 2 sprays in each nostril daily, last fill 12.30.13

## 2012-11-05 MED ORDER — FLUTICASONE PROPIONATE 50 MCG/ACT NA SUSP: 2.0000 | Freq: Every day | NASAL | Status: DC

## 2012-11-05 MED ORDER — OLOPATADINE HCL 0.2 % OP SOLN: OPHTHALMIC | Status: DC

## 2012-11-05 NOTE — Telephone Encounter (Signed)
Refill done.  

## 2012-11-10 ENCOUNTER — Ambulatory Visit (HOSPITAL_COMMUNITY): Payer: BC Managed Care – PPO

## 2012-11-12 ENCOUNTER — Ambulatory Visit (HOSPITAL_COMMUNITY): Payer: BC Managed Care – PPO

## 2012-11-19 ENCOUNTER — Ambulatory Visit (HOSPITAL_COMMUNITY)
Admission: RE | Admit: 2012-11-19 | Discharge: 2012-11-19 | Disposition: A | Discharge status: Home / Self Care | Payer: BC Managed Care – PPO | Source: Ambulatory Visit | Attending: Family Medicine | Admitting: Family Medicine

## 2012-11-19 DIAGNOSIS — Z1231 Encounter for screening mammogram for malignant neoplasm of breast: Secondary | ICD-10-CM | POA: Insufficient documentation

## 2012-12-17 ENCOUNTER — Encounter: Payer: Self-pay | Admitting: Family Medicine

## 2012-12-17 ENCOUNTER — Ambulatory Visit (INDEPENDENT_AMBULATORY_CARE_PROVIDER_SITE_OTHER): Payer: BC Managed Care – PPO | Admitting: Family Medicine

## 2012-12-17 VITALS — BP 110/78 | HR 83 | Temp 98.5°F | Ht 61.5 in | Wt 154.4 lb

## 2012-12-17 DIAGNOSIS — J329 Chronic sinusitis, unspecified: Secondary | ICD-10-CM

## 2012-12-17 MED ORDER — AMOXICILLIN 875 MG PO TABS: 875.0000 mg | ORAL_TABLET | Freq: Two times a day (BID) | ORAL | Status: DC

## 2012-12-17 NOTE — Assessment & Plan Note (Signed)
Pt's sxs and PE consistent w/ sinus infxn.  Start abx.  Reviewed supportive care and red flags that should prompt return.  Pt expressed understanding and is in agreement w/ plan.  

## 2012-12-17 NOTE — Progress Notes (Signed)
  Subjective:    Patient ID: Felicia Ball, female    DOB: 1956-09-29, 57 y.o.   MRN: [id number]  HPI URI- sxs started 2 weeks ago w/ sore throat and chest congestion.  Started Mucinex and sxs became intermittent.  Began having green nasal drainage.  No fevers.  + maxillary pain.  Bilateral ear fullness.  + bloody nasal drainage.  + productive cough.  + sick contacts.  Still on Zyrtec and eye drops- stopped Flonase due to easy nasal bruising   Review of Systems For ROS see HPI     Objective:   Physical Exam  Vitals reviewed. Constitutional: She appears well-developed and well-nourished. No distress.  HENT:  Head: Normocephalic and atraumatic.  Right Ear: Tympanic membrane normal.  Left Ear: Tympanic membrane normal.  Nose: Mucosal edema and rhinorrhea present. Right sinus exhibits maxillary sinus tenderness. Right sinus exhibits no frontal sinus tenderness. Left sinus exhibits maxillary sinus tenderness. Left sinus exhibits no frontal sinus tenderness.  Mouth/Throat: Uvula is midline and mucous membranes are normal. Posterior oropharyngeal erythema present. No oropharyngeal exudate.  Eyes: Conjunctivae and EOM are normal. Pupils are equal, round, and reactive to light.  Neck: Normal range of motion. Neck supple.  Cardiovascular: Normal rate, regular rhythm and normal heart sounds.   Pulmonary/Chest: Effort normal and breath sounds normal. No respiratory distress. She has no wheezes.  Lymphadenopathy:    She has no cervical adenopathy.          Assessment & Plan:

## 2012-12-17 NOTE — Patient Instructions (Addendum)
This is a sinus infection Start the Amox- twice daily, take w/ food Drink plenty of fluids Mucinex DM for cough and congestion Continue the Zyrtec and eye drops daily REST! Hang in there!

## 2013-02-02 ENCOUNTER — Other Ambulatory Visit: Payer: Self-pay | Admitting: Internal Medicine

## 2013-02-03 ENCOUNTER — Other Ambulatory Visit: Payer: Self-pay | Admitting: General Practice

## 2013-02-03 MED ORDER — OLOPATADINE HCL 0.2 % OP SOLN: OPHTHALMIC | Status: DC

## 2013-02-03 NOTE — Telephone Encounter (Signed)
Med filled.  

## 2013-02-04 NOTE — Telephone Encounter (Signed)
Rx sent to the pharmacy by e-script.//AB/CMA 

## 2013-02-28 ENCOUNTER — Other Ambulatory Visit: Payer: Self-pay | Admitting: Internal Medicine

## 2013-03-01 NOTE — Telephone Encounter (Signed)
Med filled.  

## 2013-05-31 ENCOUNTER — Telehealth: Payer: Self-pay | Admitting: Family Medicine

## 2013-05-31 NOTE — Telephone Encounter (Signed)
Patient called stating she does not want to take the birth control pills any longer. She states she is really not having much of a period anymore and she believes it has led to weight gain. Patient would like to know if it's okay to stop medication.

## 2013-06-01 NOTE — Telephone Encounter (Signed)
Pt.notified

## 2013-06-01 NOTE — Telephone Encounter (Signed)
Ok to stop pills at end of next cycle

## 2013-06-06 ENCOUNTER — Other Ambulatory Visit: Payer: Self-pay | Admitting: Family Medicine

## 2013-06-16 ENCOUNTER — Other Ambulatory Visit: Payer: Self-pay | Admitting: Family Medicine

## 2013-06-17 NOTE — Telephone Encounter (Signed)
Rx filled and sent to [facility] pharmacy

## 2013-07-26 ENCOUNTER — Telehealth: Payer: Self-pay

## 2013-07-26 NOTE — Telephone Encounter (Addendum)
Left message for call back  identifiable  Medication and allergies: reviewed and updated   90 day supply/mail order: na Local pharmacy: [facility] [address]   Immunizations due:  UTD  A/P:  Flu vaccine 07/07/2013--HM updated  No changes to FH or PSH Seeing Dr Yetta Barre (Spine Specialist) has 3 bulging disc CCS-never went to referral [facility]-10/2012--Neg PAP--05/2012--Neg  To Discuss with Provider: Talk more about BCP

## 2013-07-27 ENCOUNTER — Ambulatory Visit (INDEPENDENT_AMBULATORY_CARE_PROVIDER_SITE_OTHER): Payer: BC Managed Care – PPO | Admitting: Family Medicine

## 2013-07-27 ENCOUNTER — Encounter: Payer: Self-pay | Admitting: Family Medicine

## 2013-07-27 VITALS — BP 130/82 | HR 69 | Temp 98.1°F | Resp 16 | Ht 61.5 in | Wt 154.4 lb

## 2013-07-27 DIAGNOSIS — Z0000 Encounter for general adult medical examination without abnormal findings: Secondary | ICD-10-CM

## 2013-07-27 NOTE — Progress Notes (Signed)
  Subjective:    Patient ID: Felicia Ball, female    DOB: [birth year], 57 y.o.   MRN: [id number]  HPI CPE- UTD on mammo, pap.  Pt is declining colonoscopy due to amount of out of pocket expense from insurance   Review of Systems Patient reports no vision/ hearing changes, adenopathy,fever, weight change,  persistant/recurrent hoarseness , swallowing issues, chest pain, palpitations, edema, persistant/recurrent cough, hemoptysis, dyspnea (rest/exertional/paroxysmal nocturnal), gastrointestinal bleeding (melena, rectal bleeding), abdominal pain, significant heartburn, bowel changes, GU symptoms (dysuria, hematuria, incontinence), Gyn symptoms (abnormal  bleeding, pain),  syncope, focal weakness, memory loss, numbness & tingling, skin/hair/nail changes, abnormal bruising or bleeding, anxiety, or depression.     Objective:   Physical Exam General Appearance:    Alert, cooperative, no distress, appears stated age  Head:    Normocephalic, without obvious abnormality, atraumatic  Eyes:    PERRL, conjunctiva/corneas clear, EOM's intact, fundi    benign, both eyes  Ears:    Normal TM's and external ear canals, both ears  Nose:   Nares normal, septum midline, mucosa normal, no drainage    or sinus tenderness  Throat:   Lips, mucosa, and tongue normal; teeth and gums normal  Neck:   Supple, symmetrical, trachea midline, no adenopathy;    Thyroid: no enlargement/tenderness/nodules  Back:     Symmetric, no curvature, ROM normal, no CVA tenderness  Lungs:     Clear to auscultation bilaterally, respirations unlabored  Chest Wall:    No tenderness or deformity   Heart:    Regular rate and rhythm, S1 and S2 normal, no murmur, rub   or gallop  Breast Exam:    Deferred to mammo  Abdomen:     Soft, non-tender, bowel sounds active all four quadrants,    no masses, no organomegaly  Genitalia:    Deferred  Rectal:    Extremities:   Extremities normal, atraumatic, no cyanosis or edema  Pulses:   2+  and symmetric all extremities  Skin:   Skin color, texture, turgor normal, no rashes or lesions  Lymph nodes:   Cervical, supraclavicular, and axillary nodes normal  Neurologic:   CNII-XII intact, normal strength, sensation and reflexes    throughout          Assessment & Plan:

## 2013-07-27 NOTE — Assessment & Plan Note (Signed)
Pt's PE WNL.  UTD on mammo, pap.  Pt declines colonoscopy due to high out of pocket cost.  Check labs.  Anticipatory guidance provided.

## 2013-07-27 NOTE — Patient Instructions (Signed)
Follow in 1 year or as needed We'll notify you of your lab results and make any changes if needed Start the [place] for the hot flashes Keep up the good work!  You look great! Call with any questions or concerns HAPPY BIRTHDAY!!!

## 2013-07-28 LAB — CBC WITH DIFFERENTIAL/PLATELET
Basophils Relative: 0.3 % (ref 0.0–3.0)
Eosinophils Absolute: 0 10*3/uL (ref 0.0–0.7)
Eosinophils Relative: 0.1 % (ref 0.0–5.0)
Hemoglobin: 13.9 g/dL (ref 12.0–15.0)
Lymphocytes Relative: 5.6 % — ABNORMAL LOW (ref 12.0–46.0)
Lymphs Abs: 0.8 10*3/uL (ref 0.7–4.0)
MCHC: 33 g/dL (ref 30.0–36.0)
MCV: 93.6 fl (ref 78.0–100.0)
Monocytes Absolute: 0.3 10*3/uL (ref 0.1–1.0)
Neutro Abs: 12.7 10*3/uL — ABNORMAL HIGH (ref 1.4–7.7)
RBC: 4.5 Mil/uL (ref 3.87–5.11)
RDW: 14.1 % (ref 11.5–14.6)

## 2013-07-28 LAB — LIPID PANEL
Cholesterol: 237 mg/dL — ABNORMAL HIGH (ref 0–200)
HDL: 105.9 mg/dL (ref >39.00)
Triglycerides: 76 mg/dL (ref 0.0–149.0)
VLDL: 15.2 mg/dL (ref 0.0–40.0)

## 2013-07-28 LAB — BASIC METABOLIC PANEL
BUN: 20 mg/dL (ref 6–23)
Calcium: 9.7 mg/dL (ref 8.4–10.5)
Chloride: 101 mEq/L (ref 96–112)
Creatinine, Ser: 0.8 mg/dL (ref 0.4–1.2)
GFR: 82.42 mL/min (ref >60.00)
Potassium: 4 mEq/L (ref 3.5–5.1)

## 2013-07-28 LAB — HEPATIC FUNCTION PANEL
ALT: 39 U/L — ABNORMAL HIGH (ref 0–35)
AST: 34 U/L (ref 0–37)
Albumin: 4.5 g/dL (ref 3.5–5.2)
Total Bilirubin: 0.6 mg/dL (ref 0.3–1.2)
Total Protein: 7.5 g/dL (ref 6.0–8.3)

## 2013-07-28 LAB — TSH: TSH: 1.4 u[IU]/mL (ref 0.35–5.50)

## 2013-07-29 ENCOUNTER — Encounter: Payer: Self-pay | Admitting: General Practice

## 2013-07-29 ENCOUNTER — Other Ambulatory Visit: Payer: Self-pay | Admitting: Family Medicine

## 2013-07-29 DIAGNOSIS — D72820 Lymphocytosis (symptomatic): Secondary | ICD-10-CM

## 2013-08-01 ENCOUNTER — Encounter: Payer: Self-pay | Admitting: General Practice

## 2013-08-01 LAB — VITAMIN D 1,25 DIHYDROXY: Vitamin D2 1, 25 (OH)2: <8 pg/mL

## 2013-08-10 ENCOUNTER — Other Ambulatory Visit: Payer: Self-pay | Admitting: Family Medicine

## 2013-08-11 NOTE — Telephone Encounter (Signed)
Med filled.  

## 2013-08-29 ENCOUNTER — Other Ambulatory Visit (INDEPENDENT_AMBULATORY_CARE_PROVIDER_SITE_OTHER): Payer: BC Managed Care – PPO

## 2013-08-29 DIAGNOSIS — D72820 Lymphocytosis (symptomatic): Secondary | ICD-10-CM

## 2013-08-30 ENCOUNTER — Encounter: Payer: Self-pay | Admitting: General Practice

## 2013-08-30 LAB — CBC WITH DIFFERENTIAL/PLATELET
Basophils Relative: 0.5 % (ref 0.0–3.0)
Eosinophils Absolute: 0.2 10*3/uL (ref 0.0–0.7)
Eosinophils Relative: 1.7 % (ref 0.0–5.0)
Hemoglobin: 14.4 g/dL (ref 12.0–15.0)
Lymphocytes Relative: 17.8 % (ref 12.0–46.0)
MCHC: 33.2 g/dL (ref 30.0–36.0)
Monocytes Relative: 6.4 % (ref 3.0–12.0)
Neutro Abs: 6.6 10*3/uL (ref 1.4–7.7)
Neutrophils Relative %: 73.6 % (ref 43.0–77.0)
RBC: 4.54 Mil/uL (ref 3.87–5.11)

## 2013-12-08 ENCOUNTER — Other Ambulatory Visit: Payer: Self-pay | Admitting: Family Medicine

## 2013-12-09 NOTE — Telephone Encounter (Signed)
Med filled.  

## 2014-02-09 ENCOUNTER — Ambulatory Visit: Payer: BC Managed Care – PPO | Admitting: Family Medicine

## 2014-02-10 ENCOUNTER — Other Ambulatory Visit: Payer: Self-pay | Admitting: General Practice

## 2014-02-10 ENCOUNTER — Telehealth: Payer: Self-pay | Admitting: Family Medicine

## 2014-02-10 MED ORDER — OLOPATADINE HCL 0.2 % OP SOLN: OPHTHALMIC | Status: DC

## 2014-02-10 NOTE — Telephone Encounter (Signed)
Spoke with pt, she is going to call her insurance to find out if another medication is preferred with her formulary.

## 2014-02-10 NOTE — Telephone Encounter (Signed)
Caller name:Jahnya   Call back number:  [phone]  Reason for call:  Pt had a coupon for RX PATADAY 0.2 % SOLN but it has expired.  Pt wants to know if we have anymore.  If not, pt wants to discuss other options for the medication.  Please contact pt.

## 2014-03-27 ENCOUNTER — Other Ambulatory Visit: Payer: Self-pay | Admitting: Family Medicine

## 2014-03-27 DIAGNOSIS — Z1231 Encounter for screening mammogram for malignant neoplasm of breast: Secondary | ICD-10-CM

## 2014-04-04 ENCOUNTER — Ambulatory Visit (HOSPITAL_COMMUNITY): Payer: BC Managed Care – PPO | Attending: Family Medicine

## 2014-04-06 ENCOUNTER — Other Ambulatory Visit: Payer: Self-pay | Admitting: Family Medicine

## 2014-04-06 DIAGNOSIS — Z1231 Encounter for screening mammogram for malignant neoplasm of breast: Secondary | ICD-10-CM

## 2014-04-07 ENCOUNTER — Ambulatory Visit (HOSPITAL_COMMUNITY)
Admission: RE | Admit: 2014-04-07 | Discharge: 2014-04-07 | Disposition: A | Discharge status: Home / Self Care | Payer: BC Managed Care – PPO | Source: Ambulatory Visit | Attending: Family Medicine | Admitting: Family Medicine

## 2014-04-07 DIAGNOSIS — Z1231 Encounter for screening mammogram for malignant neoplasm of breast: Secondary | ICD-10-CM | POA: Insufficient documentation

## 2014-04-13 ENCOUNTER — Other Ambulatory Visit: Payer: Self-pay | Admitting: Family Medicine

## 2014-04-13 NOTE — Telephone Encounter (Signed)
Med filled.  

## 2014-05-12 ENCOUNTER — Other Ambulatory Visit: Payer: Self-pay | Admitting: Family Medicine

## 2014-05-17 ENCOUNTER — Other Ambulatory Visit: Payer: Self-pay | Admitting: Family Medicine

## 2014-05-17 NOTE — Telephone Encounter (Signed)
Med filled.  

## 2014-05-30 ENCOUNTER — Encounter: Payer: Self-pay | Admitting: Family Medicine

## 2014-05-30 ENCOUNTER — Ambulatory Visit (INDEPENDENT_AMBULATORY_CARE_PROVIDER_SITE_OTHER): Payer: BC Managed Care – PPO | Admitting: Family Medicine

## 2014-05-30 DIAGNOSIS — M542 Cervicalgia: Secondary | ICD-10-CM

## 2014-05-30 DIAGNOSIS — M25569 Pain in unspecified knee: Secondary | ICD-10-CM

## 2014-05-30 DIAGNOSIS — M25562 Pain in left knee: Secondary | ICD-10-CM

## 2014-05-30 MED ORDER — CYCLOBENZAPRINE HCL 10 MG PO TABS: 10.0000 mg | ORAL_TABLET | Freq: Every day | ORAL | Status: DC

## 2014-05-30 MED ORDER — MELOXICAM 15 MG PO TABS: 15.0000 mg | ORAL_TABLET | Freq: Every day | ORAL | Status: DC
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
## 2014-05-30 NOTE — Progress Notes (Signed)
Pre visit review using our clinic review tool, if applicable. No additional management support is needed unless otherwise documented below in the visit note. 

## 2014-05-30 NOTE — Assessment & Plan Note (Signed)
New.  Suspect pain is due to muscle spasm and soft tissue strain.  No obvious bony abnormality.  Scheduled NSAIDs, muscle relaxers prn.  Heat.  Reviewed supportive care and red flags that should prompt return.  Pt expressed understanding and is in agreement w/ plan.
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
## 2014-05-30 NOTE — Patient Instructions (Signed)
Follow up as needed Start the Mobic daily for inflammation (take w/ food) Use the flexeril nightly for pain/spasm HEAT! If no improvement after 10-14 days, call Dr Ronnald Ramp Do not settle anything until you feel better! Hang in there!!!
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
## 2014-06-14 ENCOUNTER — Telehealth: Payer: Self-pay | Admitting: Family Medicine

## 2014-06-14 NOTE — Telephone Encounter (Signed)
Pt notified of suggestion.
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
## 2014-06-14 NOTE — Telephone Encounter (Signed)
If pt is looking for chiropractic care- we recommend [facility]

## 2014-11-16 ENCOUNTER — Other Ambulatory Visit: Payer: Self-pay | Admitting: Family Medicine

## 2014-11-16 NOTE — Telephone Encounter (Signed)
Med filled and letter mailed to pt to schedule a complete physical

## 2014-12-15 ENCOUNTER — Other Ambulatory Visit: Payer: Self-pay | Admitting: Family Medicine

## 2014-12-15 NOTE — Telephone Encounter (Signed)
Med filled.  

## 2014-12-28 ENCOUNTER — Other Ambulatory Visit: Payer: Self-pay | Admitting: Family Medicine

## 2014-12-28 NOTE — Telephone Encounter (Signed)
Med filled.  

## 2015-02-25 ENCOUNTER — Other Ambulatory Visit: Payer: Self-pay | Admitting: Family Medicine

## 2015-04-16 ENCOUNTER — Telehealth: Payer: Self-pay | Admitting: Family Medicine

## 2015-04-16 NOTE — Telephone Encounter (Addendum)
pre visit letter mailed 04/12/15

## 2015-05-01 ENCOUNTER — Other Ambulatory Visit: Payer: Self-pay | Admitting: Family Medicine

## 2015-05-01 DIAGNOSIS — Z1231 Encounter for screening mammogram for malignant neoplasm of breast: Secondary | ICD-10-CM

## 2015-05-02 ENCOUNTER — Telehealth: Payer: Self-pay

## 2015-05-02 NOTE — Telephone Encounter (Signed)
LMOVM

## 2015-05-02 NOTE — Telephone Encounter (Signed)
Pre visit call completed 

## 2015-05-02 NOTE — Telephone Encounter (Signed)
Pt returning your call, please call back

## 2015-05-03 ENCOUNTER — Encounter: Payer: Self-pay | Admitting: Internal Medicine

## 2015-05-03 ENCOUNTER — Ambulatory Visit (INDEPENDENT_AMBULATORY_CARE_PROVIDER_SITE_OTHER): Payer: BC Managed Care – PPO | Admitting: Family Medicine

## 2015-05-03 ENCOUNTER — Encounter: Payer: Self-pay | Admitting: Family Medicine

## 2015-05-03 ENCOUNTER — Other Ambulatory Visit (HOSPITAL_COMMUNITY)
Admission: RE | Admit: 2015-05-03 | Discharge: 2015-05-03 | Disposition: A | Discharge status: Home / Self Care | Payer: BC Managed Care – PPO | Source: Ambulatory Visit | Attending: Family Medicine | Admitting: Family Medicine

## 2015-05-03 VITALS — BP 120/78 | HR 82 | Temp 98.1°F | Resp 16 | Ht 62.0 in | Wt 149.2 lb

## 2015-05-03 DIAGNOSIS — Z1151 Encounter for screening for human papillomavirus (HPV): Secondary | ICD-10-CM | POA: Insufficient documentation

## 2015-05-03 DIAGNOSIS — Z01419 Encounter for gynecological examination (general) (routine) without abnormal findings: Secondary | ICD-10-CM | POA: Diagnosis not present

## 2015-05-03 DIAGNOSIS — IMO0002 Reserved for concepts with insufficient information to code with codable children: Secondary | ICD-10-CM | POA: Insufficient documentation

## 2015-05-03 DIAGNOSIS — Z1211 Encounter for screening for malignant neoplasm of colon: Secondary | ICD-10-CM | POA: Diagnosis not present

## 2015-05-03 DIAGNOSIS — Z124 Encounter for screening for malignant neoplasm of cervix: Secondary | ICD-10-CM | POA: Diagnosis not present

## 2015-05-03 DIAGNOSIS — N941 Dyspareunia: Secondary | ICD-10-CM | POA: Diagnosis not present

## 2015-05-03 DIAGNOSIS — Z0000 Encounter for general adult medical examination without abnormal findings: Secondary | ICD-10-CM

## 2015-05-03 LAB — HEPATIC FUNCTION PANEL
ALT: 18 U/L (ref 0–35)
AST: 34 U/L (ref 0–37)
Albumin: 4.6 g/dL (ref 3.5–5.2)
Alkaline Phosphatase: 82 U/L (ref 39–117)
Bilirubin, Direct: 0.2 mg/dL (ref 0.0–0.3)
Total Bilirubin: 0.7 mg/dL (ref 0.2–1.2)
Total Protein: 7.9 g/dL (ref 6.0–8.3)

## 2015-05-03 LAB — BASIC METABOLIC PANEL
BUN: 15 mg/dL (ref 6–23)
CHLORIDE: 100 meq/L (ref 96–112)
CO2: 29 mEq/L (ref 19–32)
Calcium: 10.1 mg/dL (ref 8.4–10.5)
Creatinine, Ser: 0.77 mg/dL (ref 0.40–1.20)
GFR: 81.9 mL/min (ref >60.00)
Glucose, Bld: 91 mg/dL (ref 70–99)
POTASSIUM: 4.1 meq/L (ref 3.5–5.1)
Sodium: 140 mEq/L (ref 135–145)

## 2015-05-03 LAB — CBC WITH DIFFERENTIAL/PLATELET
Basophils Absolute: 0 10*3/uL (ref 0.0–0.1)
Basophils Relative: 0.6 % (ref 0.0–3.0)
EOS ABS: 0.4 10*3/uL (ref 0.0–0.7)
Eosinophils Relative: 5.7 % — ABNORMAL HIGH (ref 0.0–5.0)
HCT: 45.2 % (ref 36.0–46.0)
Hemoglobin: 15.1 g/dL — ABNORMAL HIGH (ref 12.0–15.0)
LYMPHS PCT: 27 % (ref 12.0–46.0)
Lymphs Abs: 1.7 10*3/uL (ref 0.7–4.0)
MCHC: 33.4 g/dL (ref 30.0–36.0)
MCV: 94.3 fl (ref 78.0–100.0)
MONO ABS: 0.4 10*3/uL (ref 0.1–1.0)
MONOS PCT: 5.7 % (ref 3.0–12.0)
NEUTROS ABS: 3.9 10*3/uL (ref 1.4–7.7)
Neutrophils Relative %: 61 % (ref 43.0–77.0)
PLATELETS: 269 10*3/uL (ref 150.0–400.0)
RBC: 4.79 Mil/uL (ref 3.87–5.11)
RDW: 14.6 % (ref 11.5–15.5)
WBC: 6.5 10*3/uL (ref 4.0–10.5)

## 2015-05-03 LAB — LIPID PANEL
Cholesterol: 254 mg/dL — ABNORMAL HIGH (ref 0–200)
HDL: 117.1 mg/dL (ref >39.00)
LDL Cholesterol: 125 mg/dL — ABNORMAL HIGH (ref 0–99)
NonHDL: 137.06
TRIGLYCERIDES: 60 mg/dL (ref 0.0–149.0)
Total CHOL/HDL Ratio: 2
VLDL: 12 mg/dL (ref 0.0–40.0)

## 2015-05-03 LAB — VITAMIN D 25 HYDROXY (VIT D DEFICIENCY, FRACTURES): VITD: 50.77 ng/mL (ref 30.00–100.00)

## 2015-05-03 LAB — TSH: TSH: 1.31 u[IU]/mL (ref 0.35–4.50)

## 2015-05-03 MED ORDER — OSPEMIFENE 60 MG PO TABS: 1.0000 | ORAL_TABLET | Freq: Every day | ORAL | Status: DC

## 2015-05-03 NOTE — Assessment & Plan Note (Signed)
Pap collected. 

## 2015-05-03 NOTE — Progress Notes (Signed)
Pre visit review using our clinic review tool, if applicable. No additional management support is needed unless otherwise documented below in the visit note. 

## 2015-05-03 NOTE — Progress Notes (Signed)
   Subjective:    Patient ID: Felicia Ball, female    DOB: [birth year], 59 y.o.   MRN: [id number]  HPI CPE- due for colonoscopy, pt wants referral.  Has mammo scheduled next week.  Pap due today.     Review of Systems Patient reports no vision/ hearing changes, adenopathy,fever, weight change,  persistant/recurrent hoarseness , swallowing issues, chest pain, palpitations, edema, persistant/recurrent cough, hemoptysis, dyspnea (rest/exertional/paroxysmal nocturnal), gastrointestinal bleeding (melena, rectal bleeding), abdominal pain, significant heartburn, bowel changes, GU symptoms (dysuria, hematuria, incontinence),  syncope, focal weakness, memory loss, numbness & tingling, skin/hair/nail changes, abnormal bruising or bleeding, anxiety, or depression.   + vaginal dryness and painful intercourse    Objective:   Physical Exam  General Appearance:    Alert, cooperative, no distress, appears stated age  Head:    Normocephalic, without obvious abnormality, atraumatic  Eyes:    PERRL, conjunctiva/corneas clear, EOM's intact, fundi    benign, both eyes  Ears:    Normal TM's and external ear canals, both ears  Nose:   Nares normal, septum midline, mucosa normal, no drainage    or sinus tenderness  Throat:   Lips, mucosa, and tongue normal; teeth and gums normal  Neck:   Supple, symmetrical, trachea midline, no adenopathy;    Thyroid: no enlargement/tenderness/nodules  Back:     Symmetric, no curvature, ROM normal, no CVA tenderness  Lungs:     Clear to auscultation bilaterally, respirations unlabored  Chest Wall:    No tenderness or deformity   Heart:    Regular rate and rhythm, S1 and S2 normal, no murmur, rub   or gallop  Breast Exam:    No tenderness, masses, or nipple abnormality  Abdomen:     Soft, non-tender, bowel sounds active all four quadrants,    no masses, no organomegaly  Genitalia:    External genitalia normal, cervix normal in appearance, no CMT, uterus in normal size  and position, adnexa w/out mass or tenderness, mucosa pink and moist, no lesions or discharge present  Rectal:    Normal external appearance  Extremities:   Extremities normal, atraumatic, no cyanosis or edema  Pulses:   2+ and symmetric all extremities  Skin:   Skin color, texture, turgor normal, no rashes or lesions  Lymph nodes:   Cervical, supraclavicular, and axillary nodes normal  Neurologic:   CNII-XII intact, normal strength, sensation and reflexes    throughout          Assessment & Plan:

## 2015-05-03 NOTE — Assessment & Plan Note (Signed)
New.  Pt having post menopausal dryness and painful intercourse.  Discussed options.  Pt would like to try Osphena.  Prescription given and discount card printed.

## 2015-05-03 NOTE — Assessment & Plan Note (Signed)
Pt's PE WNL.  Has mammo scheduled for next week.  Colonoscopy referral entered.  Check labs.  Anticipatory guidance provided.

## 2015-05-03 NOTE — Patient Instructions (Signed)
Follow up in 1 year or as needed We'll notify you of your lab results and make any changes if needed Keep up the good work on healthy diet and regular exercise- you look great! Have them send me a copy of your mammogram report We'll call you with your GI appt for the colonoscopy Start the [place] daily Call with any questions or concerns Enjoy the rest of your summer!!!

## 2015-05-04 LAB — CYTOLOGY - PAP

## 2015-05-09 ENCOUNTER — Ambulatory Visit (HOSPITAL_COMMUNITY)
Admission: RE | Admit: 2015-05-09 | Discharge: 2015-05-09 | Disposition: A | Discharge status: Home / Self Care | Payer: BC Managed Care – PPO | Source: Ambulatory Visit | Attending: Family Medicine | Admitting: Family Medicine

## 2015-05-09 DIAGNOSIS — Z1231 Encounter for screening mammogram for malignant neoplasm of breast: Secondary | ICD-10-CM | POA: Insufficient documentation

## 2015-05-10 ENCOUNTER — Telehealth: Payer: Self-pay | Admitting: Family Medicine

## 2015-05-10 NOTE — Telephone Encounter (Signed)
Called pt and informed, she expressed an understanding.

## 2015-05-10 NOTE — Telephone Encounter (Signed)
Pt went to get RX  Ospemifene (OSPHENA) 60 MG TABS 30 tablet 6 05/03/2015      Sig - Route: Take 1 tablet by mouth daily. - Oral    She said with the coupons given it is still $99. She wants to know if we have additional coupons/discounts. If not she isn't going to try it because it is so expensive.  Ph# [phone]

## 2015-05-10 NOTE — Telephone Encounter (Signed)
Please advise 

## 2015-05-10 NOTE — Telephone Encounter (Signed)
i don't have any additional coupons.  I understand that she will not fill this.

## 2015-05-16 ENCOUNTER — Other Ambulatory Visit: Payer: Self-pay | Admitting: Family Medicine

## 2015-05-16 NOTE — Telephone Encounter (Signed)
Medication filled to pharmacy as requested.   

## 2015-07-09 ENCOUNTER — Telehealth: Payer: Self-pay

## 2015-07-09 NOTE — Telephone Encounter (Signed)
Attempt work and home number concerning missed pv appt. Left message on mobile phone advising pt will need to call back and reschedule both pv and colonoscopy.

## 2015-07-18 ENCOUNTER — Ambulatory Visit (AMBULATORY_SURGERY_CENTER): Payer: Self-pay | Admitting: *Deleted

## 2015-07-18 VITALS — Ht 61.5 in | Wt 153.4 lb

## 2015-07-18 DIAGNOSIS — Z1211 Encounter for screening for malignant neoplasm of colon: Secondary | ICD-10-CM

## 2015-07-18 MED ORDER — NA SULFATE-K SULFATE-MG SULF 17.5-3.13-1.6 GM/177ML PO SOLN: 1.0000 | Freq: Once | ORAL | Status: DC

## 2015-07-18 NOTE — Progress Notes (Signed)
No egg or soy allergy No issues with past sedation has had N/V with wisdom teeth No diet pills No home 02 use  emmi declined

## 2015-07-23 ENCOUNTER — Encounter: Payer: BC Managed Care – PPO | Admitting: Internal Medicine

## 2015-08-07 ENCOUNTER — Encounter: Payer: Self-pay | Admitting: Internal Medicine

## 2015-08-14 ENCOUNTER — Encounter: Payer: BC Managed Care – PPO | Admitting: Internal Medicine

## 2015-08-15 ENCOUNTER — Ambulatory Visit (AMBULATORY_SURGERY_CENTER): Payer: BC Managed Care – PPO | Admitting: Internal Medicine

## 2015-08-15 ENCOUNTER — Encounter: Payer: Self-pay | Admitting: Internal Medicine

## 2015-08-15 ENCOUNTER — Encounter: Payer: BC Managed Care – PPO | Admitting: Internal Medicine

## 2015-08-15 VITALS — BP 120/52 | HR 62 | Temp 96.2°F | Resp 16 | Ht 61.0 in | Wt 153.0 lb

## 2015-08-15 DIAGNOSIS — Z1211 Encounter for screening for malignant neoplasm of colon: Secondary | ICD-10-CM | POA: Diagnosis not present

## 2015-08-15 DIAGNOSIS — D122 Benign neoplasm of ascending colon: Secondary | ICD-10-CM | POA: Diagnosis not present

## 2015-08-15 MED ORDER — SODIUM CHLORIDE 0.9 % IV SOLN: 500.0000 mL | INTRAVENOUS | Status: DC

## 2015-08-15 NOTE — Progress Notes (Signed)
Called to room to assist during endoscopic procedure.  Patient ID and intended procedure confirmed with present staff. Received instructions for my participation in the procedure from the performing physician.  

## 2015-08-15 NOTE — Op Note (Signed)
[facility]  [address] [place], [address]   COLONOSCOPY PROCEDURE REPORT  PATIENT: Felicia Ball, Felicia Ball  MR#: [id number] BIRTHDATE: 11/21/1955 , 11  yrs. old GENDER: female ENDOSCOPIST: Eustace Quail, MD REFERRED CB:[id number] Birdie Riddle, M.D. PROCEDURE DATE:  08/15/2015 PROCEDURE:   Colonoscopy, screening and Colonoscopy with snare polypectomy x 1 First Screening Colonoscopy - Avg.  risk and is 50 yrs.  old or older Yes.  Prior Negative Screening - Now for repeat screening. N/A  History of Adenoma - Now for follow-up colonoscopy & has been > or = to 3 yrs.  N/A  Polyps removed today? Yes ASA CLASS:   Class I INDICATIONS:Screening for colonic neoplasia and Colorectal Neoplasm Risk Assessment for this procedure is average risk. MEDICATIONS: Monitored anesthesia care and Propofol 400 mg IV  DESCRIPTION OF PROCEDURE:   After the risks benefits and alternatives of the procedure were thoroughly explained, informed consent was obtained.  The digital rectal exam revealed no abnormalities of the rectum.   The LB [id number]  endoscope was introduced through the anus and advanced to the cecum, which was identified by both the appendix and ileocecal valve. No adverse events experienced.   The quality of the prep was excellent. (Suprep was used)  The instrument was then slowly withdrawn as the colon was fully examined. Estimated blood loss is zero unless otherwise noted in this procedure report.     COLON FINDINGS: A single polyp measuring 6 mm in size was found in the ascending colon.  A polypectomy was performed with a cold snare.  The resection was complete, the polyp tissue was completely retrieved and sent to histology.   The examination was otherwise normal.  Retroflexed views revealed internal hemorrhoids. The time to cecum = 5.7 Withdrawal time = 8.1   The scope was withdrawn and the procedure completed. COMPLICATIONS: There were no immediate  complications.  ENDOSCOPIC IMPRESSION: 1.   Single polyp was found in the ascending colon; polypectomy was performed with a cold snare 2.   The examination was otherwise normal  RECOMMENDATIONS: 1. Repeat colonoscopy in 5 years if polyp adenomatous; otherwise 10 years  eSigned:  Eustace Quail, MD 08/15/2015 3:36 PM   cc: The Patient and Midge Minium, MD

## 2015-08-15 NOTE — Patient Instructions (Signed)
YOU HAD AN ENDOSCOPIC PROCEDURE TODAY AT THE [facility]:   Refer to the procedure report that was given to you for any specific questions about what was found during the examination.  If the procedure report does not answer your questions, please call your gastroenterologist to clarify.  If you requested that your care partner not be given the details of your procedure findings, then the procedure report has been included in a sealed envelope for you to review at your convenience later.  YOU SHOULD EXPECT: Some feelings of bloating in the abdomen. Passage of more gas than usual.  Walking can help get rid of the air that was put into your GI tract during the procedure and reduce the bloating. If you had a lower endoscopy (such as a colonoscopy or flexible sigmoidoscopy) you may notice spotting of blood in your stool or on the toilet paper. If you underwent a bowel prep for your procedure, you may not have a normal bowel movement for a few days.  Please Note:  You might notice some irritation and congestion in your nose or some drainage.  This is from the oxygen used during your procedure.  There is no need for concern and it should clear up in a day or so.  SYMPTOMS TO REPORT IMMEDIATELY:   Following lower endoscopy (colonoscopy or flexible sigmoidoscopy):  Excessive amounts of blood in the stool  Significant tenderness or worsening of abdominal pains  Swelling of the abdomen that is new, acute  Fever of 100F or higher   For urgent or emergent issues, a gastroenterologist can be reached at any hour by calling [phone].   DIET: Your first meal following the procedure should be a small meal and then it is ok to progress to your normal diet. Heavy or fried foods are harder to digest and may make you feel nauseous or bloated.  Likewise, meals heavy in dairy and vegetables can increase bloating.  Drink plenty of fluids but you should avoid alcoholic beverages for 24  hours.  ACTIVITY:  You should plan to take it easy for the rest of today and you should NOT DRIVE or use heavy machinery until tomorrow (because of the sedation medicines used during the test).    FOLLOW UP: Our staff will call the number listed on your records the next business day following your procedure to check on you and address any questions or concerns that you may have regarding the information given to you following your procedure. If we do not reach you, we will leave a message.  However, if you are feeling well and you are not experiencing any problems, there is no need to return our call.  We will assume that you have returned to your regular daily activities without incident.  If any biopsies were taken you will be contacted by phone or by letter within the next 1-3 weeks.  Please call us at [phone] if you have not heard about the biopsies in 3 weeks.    SIGNATURES/CONFIDENTIALITY: You and/or your care partner have signed paperwork which will be entered into your electronic medical record.  These signatures attest to the fact that that the information above on your After Visit Summary has been reviewed and is understood.  Full responsibility of the confidentiality of this discharge information lies with you and/or your care-partner. 

## 2015-08-15 NOTE — Progress Notes (Signed)
Patient awakening,vss,report to rn 

## 2015-08-16 ENCOUNTER — Telehealth: Payer: Self-pay | Admitting: *Deleted

## 2015-08-16 NOTE — Telephone Encounter (Signed)
  Follow up Call-  Call back number 08/15/2015  Post procedure Call Back phone  # [phone]  Permission to leave phone message Yes    [facility]

## 2015-08-21 ENCOUNTER — Encounter: Payer: Self-pay | Admitting: Internal Medicine

## 2015-11-06 ENCOUNTER — Telehealth: Payer: Self-pay | Admitting: Family Medicine

## 2015-11-06 NOTE — Telephone Encounter (Signed)
Patient declined at this time and will call back to schedule if she changes her mind

## 2015-11-07 NOTE — Telephone Encounter (Signed)
Chart updated to reflect 

## 2016-01-13 ENCOUNTER — Other Ambulatory Visit: Payer: Self-pay | Admitting: Family Medicine

## 2016-01-14 NOTE — Telephone Encounter (Signed)
Medication filled to pharmacy as requested.   

## 2016-07-24 ENCOUNTER — Other Ambulatory Visit: Payer: Self-pay | Admitting: Family Medicine

## 2016-07-24 DIAGNOSIS — Z1231 Encounter for screening mammogram for malignant neoplasm of breast: Secondary | ICD-10-CM

## 2016-08-13 ENCOUNTER — Ambulatory Visit
Admission: RE | Admit: 2016-08-13 | Discharge: 2016-08-13 | Disposition: A | Discharge status: Home / Self Care | Payer: BC Managed Care – PPO | Source: Ambulatory Visit | Attending: Family Medicine | Admitting: Family Medicine

## 2016-08-13 DIAGNOSIS — Z1231 Encounter for screening mammogram for malignant neoplasm of breast: Secondary | ICD-10-CM

## 2017-07-23 ENCOUNTER — Telehealth: Payer: BC Managed Care – PPO | Admitting: Physician Assistant

## 2017-07-23 DIAGNOSIS — B9689 Other specified bacterial agents as the cause of diseases classified elsewhere: Secondary | ICD-10-CM

## 2017-07-23 DIAGNOSIS — J019 Acute sinusitis, unspecified: Secondary | ICD-10-CM

## 2017-07-23 MED ORDER — AMOXICILLIN-POT CLAVULANATE 875-125 MG PO TABS: 1.0000 | ORAL_TABLET | Freq: Two times a day (BID) | ORAL | 0 refills | Status: DC

## 2017-07-23 NOTE — Progress Notes (Signed)

## 2017-09-23 ENCOUNTER — Telehealth: Payer: BC Managed Care – PPO | Admitting: Family

## 2017-09-23 DIAGNOSIS — J208 Acute bronchitis due to other specified organisms: Secondary | ICD-10-CM

## 2017-09-23 NOTE — Progress Notes (Signed)

## 2018-02-16 ENCOUNTER — Encounter: Payer: Self-pay | Admitting: General Practice

## 2018-03-03 ENCOUNTER — Other Ambulatory Visit: Payer: Self-pay | Admitting: Family Medicine

## 2018-03-03 DIAGNOSIS — Z1231 Encounter for screening mammogram for malignant neoplasm of breast: Secondary | ICD-10-CM

## 2018-04-06 ENCOUNTER — Ambulatory Visit
Admission: RE | Admit: 2018-04-06 | Discharge: 2018-04-06 | Disposition: A | Discharge status: Home / Self Care | Payer: BC Managed Care – PPO | Source: Ambulatory Visit | Attending: Family Medicine | Admitting: Family Medicine

## 2018-04-06 DIAGNOSIS — Z1231 Encounter for screening mammogram for malignant neoplasm of breast: Secondary | ICD-10-CM

## 2019-06-08 ENCOUNTER — Other Ambulatory Visit: Payer: Self-pay | Admitting: Family Medicine

## 2019-06-08 DIAGNOSIS — Z1231 Encounter for screening mammogram for malignant neoplasm of breast: Secondary | ICD-10-CM

## 2019-07-21 ENCOUNTER — Other Ambulatory Visit: Payer: Self-pay

## 2019-07-21 ENCOUNTER — Ambulatory Visit
Admission: RE | Admit: 2019-07-21 | Discharge: 2019-07-21 | Disposition: A | Discharge status: Home / Self Care | Payer: BC Managed Care – PPO | Source: Ambulatory Visit | Attending: Family Medicine | Admitting: Family Medicine

## 2019-07-21 DIAGNOSIS — Z1231 Encounter for screening mammogram for malignant neoplasm of breast: Secondary | ICD-10-CM

## 2020-03-27 ENCOUNTER — Ambulatory Visit: Payer: BC Managed Care – PPO | Admitting: Family Medicine

## 2020-04-04 LAB — HM PAP SMEAR: HM Pap smear: NORMAL

## 2020-11-07 ENCOUNTER — Other Ambulatory Visit: Payer: Self-pay | Admitting: Family Medicine

## 2020-11-07 ENCOUNTER — Encounter: Payer: Self-pay | Admitting: Internal Medicine

## 2020-11-07 DIAGNOSIS — Z1231 Encounter for screening mammogram for malignant neoplasm of breast: Secondary | ICD-10-CM

## 2020-12-28 ENCOUNTER — Other Ambulatory Visit: Payer: Self-pay

## 2020-12-28 ENCOUNTER — Ambulatory Visit
Admission: RE | Admit: 2020-12-28 | Discharge: 2020-12-28 | Disposition: A | Discharge status: Home / Self Care | Payer: BC Managed Care – PPO | Source: Ambulatory Visit | Attending: Family Medicine | Admitting: Family Medicine

## 2020-12-28 DIAGNOSIS — Z1231 Encounter for screening mammogram for malignant neoplasm of breast: Secondary | ICD-10-CM

## 2021-03-25 ENCOUNTER — Telehealth: Payer: BC Managed Care – PPO | Admitting: Physician Assistant

## 2021-03-25 ENCOUNTER — Encounter: Payer: Self-pay | Admitting: Physician Assistant

## 2021-03-25 DIAGNOSIS — S80861A Insect bite (nonvenomous), right lower leg, initial encounter: Secondary | ICD-10-CM

## 2021-03-25 DIAGNOSIS — W57XXXA Bitten or stung by nonvenomous insect and other nonvenomous arthropods, initial encounter: Secondary | ICD-10-CM

## 2021-03-25 MED ORDER — DOXYCYCLINE HYCLATE 100 MG PO TABS: 200.0000 mg | ORAL_TABLET | Freq: Two times a day (BID) | ORAL | 0 refills | Status: DC

## 2021-03-25 NOTE — Patient Instructions (Signed)
Tick Bite Information, Adult Ticks are insects that draw blood for food. Most ticks live in shrubs and grassy and wooded areas. They climb onto people and animals that brush against the leaves and grasses that they rest on. Then they bite, attaching themselves to the skin. Most ticks are harmless, but some ticks may carry germs that can spread to a person through a bite and cause a disease. To reduce your risk of getting a disease from a tick bite, make sure you: Take steps to prevent tick bites. Check for ticks after being outdoors where ticks live. Watch for symptoms of disease if a tick attached to you or if you suspect a tick bite. How can I prevent tick bites? Take these steps to help prevent tick bites when you go outdoors in an areawhere ticks live: Use insect repellent Use insect repellent that has DEET (20% or higher), picaridin, or IR3535 in it. Follow the instructions on the label. Use these products on: Bare skin. The top of your boots. Your pant legs. Your sleeve cuffs. For insect repellent that contains permethrin, follow the instructions on the label. Use these products on: Clothing. Boots. Outdoor gear. Tents. When you are outside Wear protective clothing. Long sleeves and long pants offer the best protection from ticks. Wear light-colored clothing so you can see ticks more easily. Tuck your pant legs into your socks. If you go walking on a trail, stay in the middle of the trail so your skin, hair, and clothing do not touch the bushes. Avoid walking through areas with long grass. Check for ticks on your clothing, hair, and skin often while you are outside, and check again before you go inside. Make sure to check the scalp, neck, armpits, waist, groin, and joint areas. These are the spots where ticks attach themselves most often. When you go indoors Check your clothing for ticks. Tumble dry clothes in a dryer on high heat for at least 10 minutes. If clothes are damp,  additional time may be needed. If clothes require washing, use hot water. Examine gear and pets. Shower soon after being outdoors. Check your body for ticks. Conduct a full body check using a mirror. What is the proper way to remove a tick? If you find a tick on your body, remove it as soon as possible. Removing a tick sooner can prevent germs from passing to your body. Do not remove the tick with your bare fingers. To remove a tick that is crawling on your skin but has not bitten, use either of these methods: Go outdoors and brush the tick off. Remove the tick with tape or a lint roller. To remove a tick that is attached to your skin: Wash your hands. If you have latex gloves, put them on. Use fine-tipped tweezers, curved forceps, or a tick-removal tool to gently grasp the tick as close to your skin and the tick's head as possible. Gently pull with a steady, upward, even pressure until the tick lets go. When removing the tick: Take care to keep the tick's head attached to its body. Do not twist or jerk the tick. This can make the tick's head or mouth parts break off and remain in the skin. Do not squeeze or crush the tick's body. This could force disease-carrying fluids from the tick into your body. Do not try to remove a tick with heat, alcohol, petroleum jelly, or fingernail polish. Using these methods can cause the tick to salivate and regurgitate intoyour bloodstream, increasing your  risk of getting a disease. What should I do after removing a tick? Dispose of the tick. Do not crush a tick with your fingers. Clean the bite area and your hands with soap and water, rubbing alcohol, or an iodine scrub. If an antiseptic cream or ointment is available, apply a small amount to the bite site. Wash and disinfect any instruments that you used to remove the tick. How should I dispose of a tick? To dispose of a live tick, use one of these methods: Place it in rubbing alcohol. Place it in a sealed  bag or container. Wrap it tightly in tape. Flush it down the toilet. Contact a health care provider if: You have symptoms of a disease after a tick bite. Symptoms of a tick-borne disease can occur from moments after the tick bites to 30 days after a tick is removed. Symptoms include: Fever or chills. Any of these signs in the bite area: A red rash that makes a circle (bull's-eye rash) in the bite area. Redness and swelling. Headache. Muscle, joint, or bone pain. Abnormal tiredness. Numbness in your legs or difficulty walking or moving your legs. Tender, swollen lymph glands. A part of a tick breaks off and gets stuck in your skin. Get help right away if: You are not able to remove a tick. You experience muscle weakness or paralysis. Your symptoms get worse or you experience new symptoms. You find an engorged tick on your skin and you are in an area where disease from ticks is a high risk. Summary Ticks may carry germs that can spread to a person through a bite and cause a disease. Wear protective clothing and use insect repellent to prevent tick bites. Follow the instructions on the label. If you find a tick on your body, remove it as soon as possible. If the tick is attached, do not try to remove with heat, alcohol, petroleum jelly, or fingernail polish. Remove the attached tick using fine-tipped tweezers, curved forceps, or a tick-removal tool. Gently pull with steady, upward, even pressure until the tick lets go. Do not twist or jerk the tick. Do not squeeze or crush the tick's body. If you have symptoms of a disease after being bitten by a tick, contact a health care provider. This information is not intended to replace advice given to you by your health care provider. Make sure you discuss any questions you have with your healthcare provider. Document Revised: 09/12/2019 Document Reviewed: 09/12/2019 Elsevier Patient Education  [place].
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
## 2021-03-26 ENCOUNTER — Encounter: Payer: Self-pay | Admitting: Physician Assistant

## 2021-03-26 DIAGNOSIS — W57XXXA Bitten or stung by nonvenomous insect and other nonvenomous arthropods, initial encounter: Secondary | ICD-10-CM

## 2021-03-26 MED ORDER — DOXYCYCLINE HYCLATE 100 MG PO TABS: 200.0000 mg | ORAL_TABLET | Freq: Once | ORAL | 0 refills | Status: AC

## 2021-03-27 ENCOUNTER — Encounter: Payer: Self-pay | Admitting: *Deleted

## 2021-07-25 ENCOUNTER — Ambulatory Visit (INDEPENDENT_AMBULATORY_CARE_PROVIDER_SITE_OTHER): Payer: BC Managed Care – PPO | Admitting: Family Medicine

## 2021-07-25 ENCOUNTER — Encounter: Payer: Self-pay | Admitting: Family Medicine

## 2021-07-25 ENCOUNTER — Other Ambulatory Visit: Payer: Self-pay

## 2021-07-25 VITALS — BP 130/88 | HR 77 | Temp 98.4°F | Resp 17 | Ht 62.0 in | Wt 140.0 lb

## 2021-07-25 DIAGNOSIS — M8580 Other specified disorders of bone density and structure, unspecified site: Secondary | ICD-10-CM | POA: Diagnosis not present

## 2021-07-25 DIAGNOSIS — E785 Hyperlipidemia, unspecified: Secondary | ICD-10-CM

## 2021-07-25 DIAGNOSIS — Z114 Encounter for screening for human immunodeficiency virus [HIV]: Secondary | ICD-10-CM | POA: Diagnosis not present

## 2021-07-25 DIAGNOSIS — Z0000 Encounter for general adult medical examination without abnormal findings: Secondary | ICD-10-CM | POA: Diagnosis not present

## 2021-07-25 DIAGNOSIS — Z23 Encounter for immunization: Secondary | ICD-10-CM

## 2021-07-25 DIAGNOSIS — Z1159 Encounter for screening for other viral diseases: Secondary | ICD-10-CM | POA: Diagnosis not present

## 2021-07-25 DIAGNOSIS — Z1211 Encounter for screening for malignant neoplasm of colon: Secondary | ICD-10-CM

## 2021-07-25 LAB — CBC WITH DIFFERENTIAL/PLATELET
Basophils Absolute: 0 10*3/uL (ref 0.0–0.1)
Basophils Relative: 0.6 % (ref 0.0–3.0)
Eosinophils Absolute: 0.1 10*3/uL (ref 0.0–0.7)
Eosinophils Relative: 1.4 % (ref 0.0–5.0)
HCT: 44 % (ref 36.0–46.0)
Hemoglobin: 14.5 g/dL (ref 12.0–15.0)
Lymphocytes Relative: 20.5 % (ref 12.0–46.0)
Lymphs Abs: 1.1 10*3/uL (ref 0.7–4.0)
MCHC: 33 g/dL (ref 30.0–36.0)
MCV: 96 fl (ref 78.0–100.0)
Monocytes Absolute: 0.3 10*3/uL (ref 0.1–1.0)
Monocytes Relative: 6.3 % (ref 3.0–12.0)
Neutro Abs: 3.8 10*3/uL (ref 1.4–7.7)
Neutrophils Relative %: 71.2 % (ref 43.0–77.0)
Platelets: 223 10*3/uL (ref 150.0–400.0)
RBC: 4.58 Mil/uL (ref 3.87–5.11)
RDW: 14.1 % (ref 11.5–15.5)
WBC: 5.3 10*3/uL (ref 4.0–10.5)

## 2021-07-25 LAB — HEPATIC FUNCTION PANEL
ALT: 19 U/L (ref 0–35)
AST: 35 U/L (ref 0–37)
Albumin: 4.9 g/dL (ref 3.5–5.2)
Alkaline Phosphatase: 48 U/L (ref 39–117)
Bilirubin, Direct: 0.1 mg/dL (ref 0.0–0.3)
Total Bilirubin: 0.7 mg/dL (ref 0.2–1.2)
Total Protein: 7.4 g/dL (ref 6.0–8.3)

## 2021-07-25 LAB — BASIC METABOLIC PANEL
BUN: 18 mg/dL (ref 6–23)
CO2: 27 mEq/L (ref 19–32)
Calcium: 9.6 mg/dL (ref 8.4–10.5)
Chloride: 103 mEq/L (ref 96–112)
Creatinine, Ser: 0.78 mg/dL (ref 0.40–1.20)
GFR: 80.51 mL/min (ref >60.00)
Glucose, Bld: 95 mg/dL (ref 70–99)
Potassium: 4.4 mEq/L (ref 3.5–5.1)
Sodium: 140 mEq/L (ref 135–145)

## 2021-07-25 LAB — LIPID PANEL
Cholesterol: 270 mg/dL — ABNORMAL HIGH (ref 0–200)
HDL: 116.1 mg/dL (ref >39.00)
LDL Cholesterol: 144 mg/dL — ABNORMAL HIGH (ref 0–99)
NonHDL: 154.31
Total CHOL/HDL Ratio: 2
Triglycerides: 52 mg/dL (ref 0.0–149.0)
VLDL: 10.4 mg/dL (ref 0.0–40.0)

## 2021-07-25 LAB — VITAMIN D 25 HYDROXY (VIT D DEFICIENCY, FRACTURES): VITD: 38.14 ng/mL (ref 30.00–100.00)

## 2021-07-25 LAB — TSH: TSH: 1.17 u[IU]/mL (ref 0.35–5.50)

## 2021-07-25 NOTE — Assessment & Plan Note (Signed)
UTD on DEXA.  Check Vit D and replete prn. 

## 2021-07-25 NOTE — Assessment & Plan Note (Signed)
Pt's PE WNL.  UTD on pap, mammo.  Due for repeat colonoscopy- referral placed.  Due for Tdap and flu- given.  Check labs.  Anticipatory guidance provided.

## 2021-07-25 NOTE — Assessment & Plan Note (Signed)
Pt has hx of this.  Check labs and determine if medication is needed

## 2021-07-25 NOTE — Patient Instructions (Addendum)
Follow up in 1 year or as needed We'll notify you of your lab results and make any changes if needed Continue to work on healthy diet and regular exercise- you look great!! We'll call you with your GI appt Call with any questions or concerns Stay Safe!  Stay Healthy! Happy Early Rudene Anda!!!

## 2021-07-25 NOTE — Progress Notes (Signed)
   Subjective:    Patient ID: Felicia Ball, female    DOB: 1955/10/23, 65 y.o.   MRN: [id number]  HPI CPE- UTD on mammo, pap and DEXA ([facility]).  Due for repeat colonoscopy, Tdap, flu.  No concerns today  Patient Care Team    Relationship Specialty Notifications Start End  Midge Minium, MD PCP - General Family Medicine  12/28/20   Irene Shipper, MD Consulting Physician Gastroenterology  07/25/21       Review of Systems Patient reports no vision/ hearing changes, adenopathy,fever, weight change,  persistant/recurrent hoarseness , swallowing issues, chest pain, palpitations, edema, persistant/recurrent cough, hemoptysis, dyspnea (rest/exertional/paroxysmal nocturnal), gastrointestinal bleeding (melena, rectal bleeding), abdominal pain, significant heartburn, bowel changes, GU symptoms (dysuria, hematuria, incontinence), Gyn symptoms (abnormal  bleeding, pain),  syncope, focal weakness, memory loss, numbness & tingling, skin/hair/nail changes, abnormal bruising or bleeding, anxiety, or depression.   This visit occurred during the SARS-CoV-2 public health emergency.  Safety protocols were in place, including screening questions prior to the visit, additional usage of staff PPE, and extensive cleaning of exam room while observing appropriate contact time as indicated for disinfecting solutions.      Objective:   Physical Exam General Appearance:    Alert, cooperative, no distress, appears stated age  Head:    Normocephalic, without obvious abnormality, atraumatic  Eyes:    PERRL, conjunctiva/corneas clear, EOM's intact, fundi    benign, both eyes  Ears:    Normal TM's and external ear canals, both ears  Nose:   Deferred due COVID  Throat:   Neck:   Supple, symmetrical, trachea midline, no adenopathy;    Thyroid: no enlargement/tenderness/nodules  Back:     Symmetric, no curvature, ROM normal, no CVA tenderness  Lungs:     Clear to auscultation bilaterally, respirations unlabored   Chest Wall:    No tenderness or deformity   Heart:    Regular rate and rhythm, S1 and S2 normal, no murmur, rub   or gallop  Breast Exam:    Deferred to GYN  Abdomen:     Soft, non-tender, bowel sounds active all four quadrants,    no masses, no organomegaly  Genitalia:    Deferred to GYN  Rectal:    Extremities:   Extremities normal, atraumatic, no cyanosis or edema  Pulses:   2+ and symmetric all extremities  Skin:   Skin color, texture, turgor normal, no rashes or lesions  Lymph nodes:   Cervical, supraclavicular, and axillary nodes normal  Neurologic:   CNII-XII intact, normal strength, sensation and reflexes    throughout          Assessment & Plan:

## 2021-07-26 LAB — HIV ANTIBODY (ROUTINE TESTING W REFLEX): HIV 1&2 Ab, 4th Generation: NONREACTIVE

## 2021-07-26 LAB — HEPATITIS C ANTIBODY
Hepatitis C Ab: NONREACTIVE
SIGNAL TO CUT-OFF: 0.08 (ref <1.00)

## 2021-08-01 ENCOUNTER — Encounter: Payer: Self-pay | Admitting: Family Medicine

## 2021-08-25 IMAGING — MG DIGITAL SCREENING BILAT W/ CAD
4 series · 4 of 4 positions shown · non-contrast
Comparison: Previous exam(s).

CLINICAL DATA: Screening.

EXAM:
DIGITAL SCREENING BILATERAL MAMMOGRAM WITH CAD
TECHNIQUE: Bilateral screening digital craniocaudal and mediolateral oblique
mammograms were obtained. The images were evaluated with
computer-aided detection.

[R MLO]
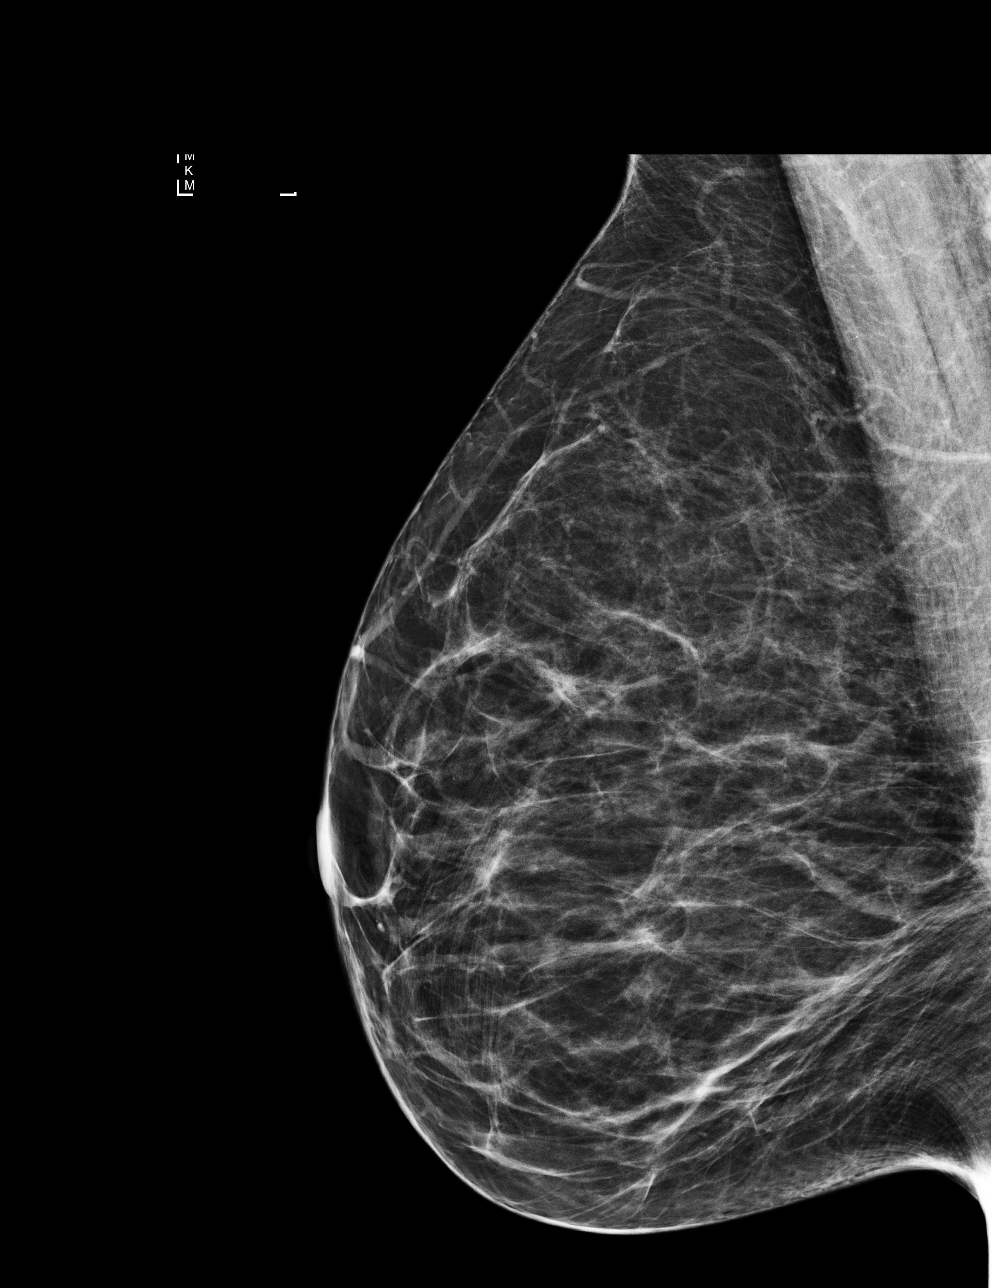

[L MLO]
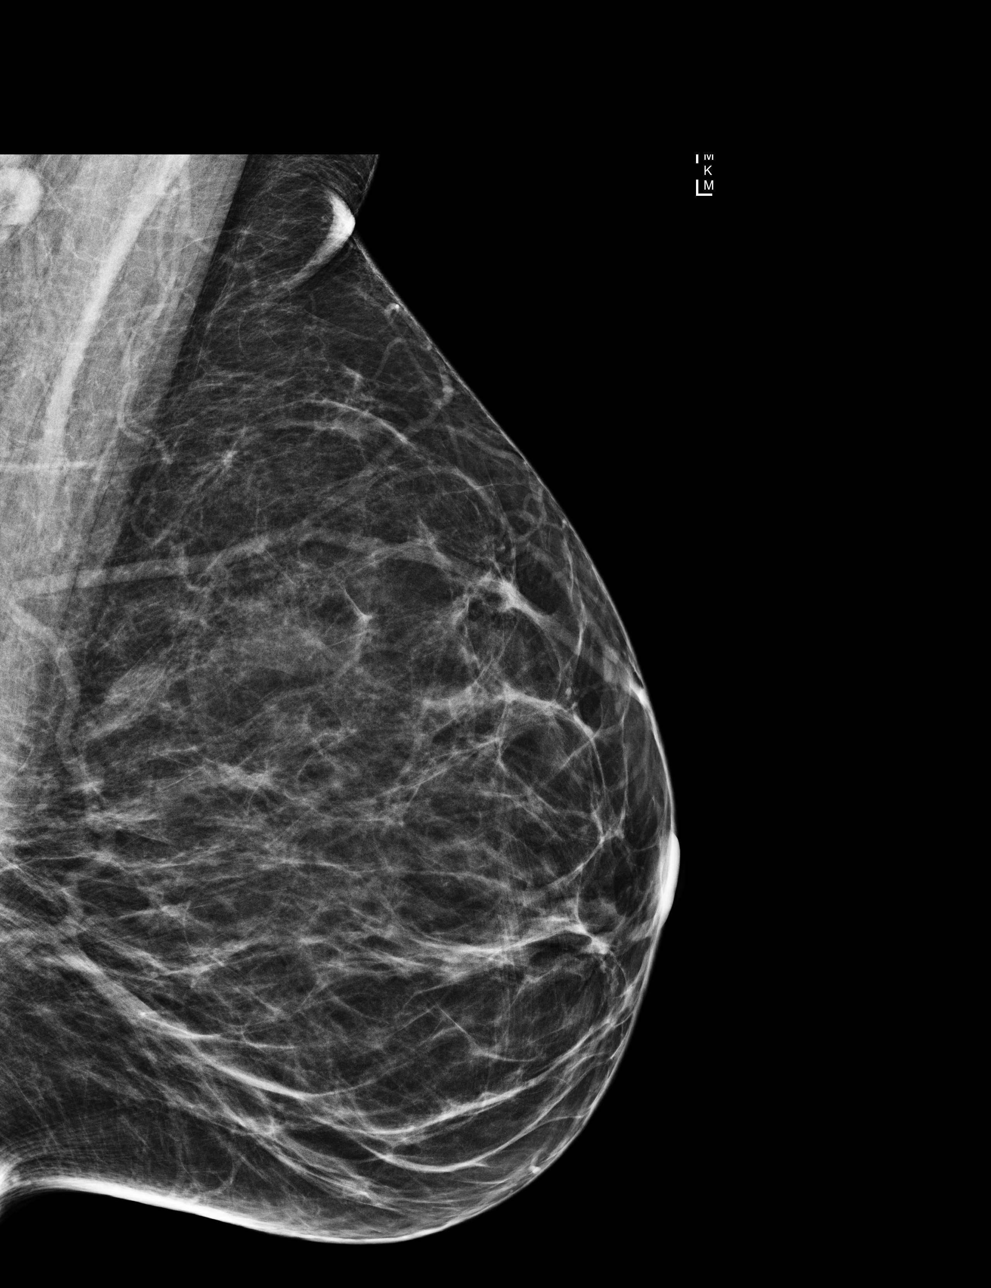

[R CC]
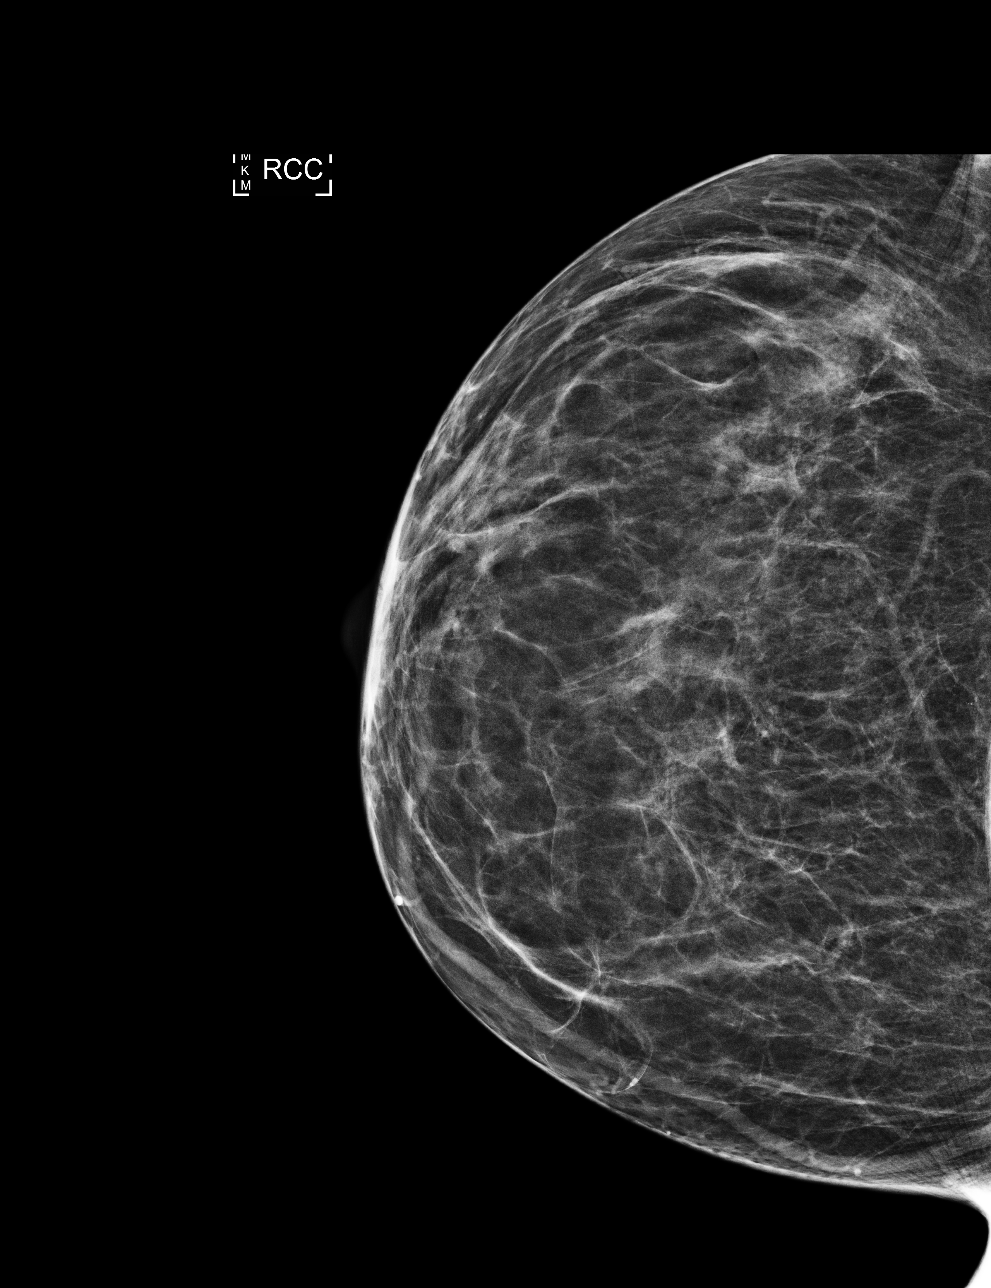

[L CC]
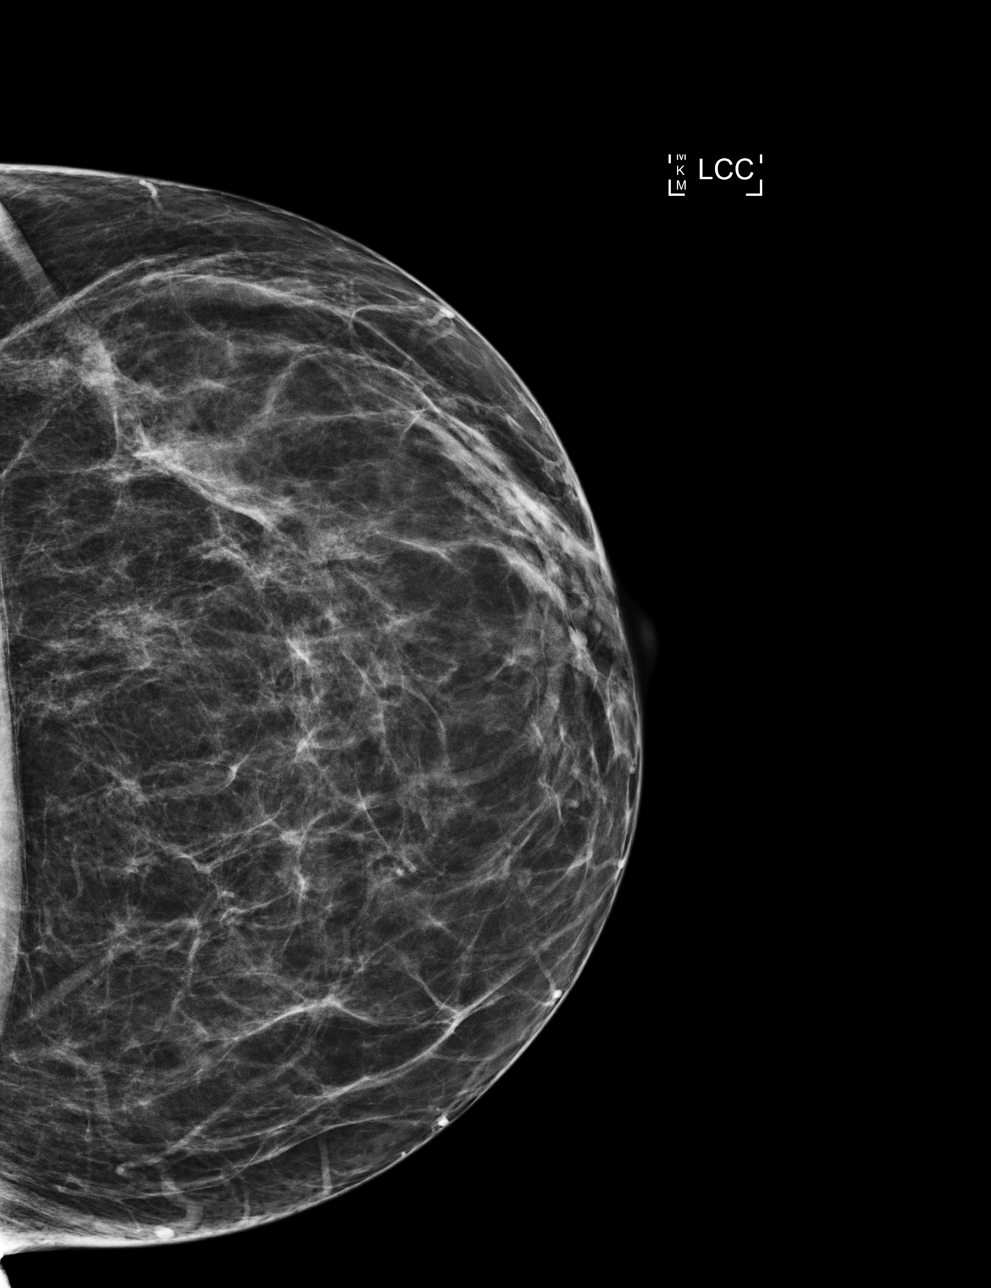

[4 of 4 positions shown; findings below may reference images not displayed]

ACR Breast Density Category b: There are scattered areas of
fibroglandular density.
FINDINGS: There are no findings suspicious for malignancy. The images were
evaluated with computer-aided detection.
IMPRESSION: No mammographic evidence of malignancy. A result letter of this
screening mammogram will be mailed directly to the patient.

RECOMMENDATION:
Screening mammogram in one year. (Code:[id number])

BI-RADS CATEGORY  1: Negative.

## 2021-09-17 ENCOUNTER — Telehealth: Payer: BC Managed Care – PPO | Admitting: Physician Assistant

## 2021-09-17 DIAGNOSIS — Z20818 Contact with and (suspected) exposure to other bacterial communicable diseases: Secondary | ICD-10-CM | POA: Diagnosis not present

## 2021-09-17 DIAGNOSIS — J029 Acute pharyngitis, unspecified: Secondary | ICD-10-CM

## 2021-09-17 MED ORDER — AMOXICILLIN 500 MG PO TABS: 500.0000 mg | ORAL_TABLET | Freq: Two times a day (BID) | ORAL | 0 refills | Status: AC

## 2021-09-17 NOTE — Progress Notes (Signed)

## 2021-09-17 NOTE — Progress Notes (Signed)
I have spent 5 minutes in review of e-visit questionnaire, review and updating patient chart, medical decision making and response to patient.   Zavien Clubb Cody Jupiter Kabir, PA-C    

## 2021-10-08 ENCOUNTER — Telehealth: Payer: BC Managed Care – PPO | Admitting: Physician Assistant

## 2021-10-08 DIAGNOSIS — B9689 Other specified bacterial agents as the cause of diseases classified elsewhere: Secondary | ICD-10-CM

## 2021-10-08 DIAGNOSIS — J019 Acute sinusitis, unspecified: Secondary | ICD-10-CM

## 2021-10-08 MED ORDER — DOXYCYCLINE HYCLATE 100 MG PO TABS: 100.0000 mg | ORAL_TABLET | Freq: Two times a day (BID) | ORAL | 0 refills | Status: DC

## 2021-10-08 NOTE — Progress Notes (Signed)
E-Visit for Sinus Problems  We are sorry that you are not feeling well.  Here is how we plan to help!  Based on what you have shared with me it looks like you have sinusitis secondary to recent COVID-19 infection.  Sinusitis is inflammation and infection in the sinus cavities of the head.  Based on your presentation I believe you most likely have Acute Bacterial Sinusitis.  This is an infection caused by bacteria and is treated with antibiotics. I have prescribed Doxycycline 100mg  by mouth twice a day for 10 days. You may use an oral decongestant such as Mucinex D or if you have glaucoma or high blood pressure use plain Mucinex. Saline nasal spray help and can safely be used as often as needed for congestion.  If you develop worsening sinus pain, fever or notice severe headache and vision changes, or if symptoms are not better after completion of antibiotic, please schedule an appointment with a health care provider.    Sinus infections are not as easily transmitted as other respiratory infection, however we still recommend that you avoid close contact with loved ones, especially the very young and elderly.  Remember to wash your hands thoroughly throughout the day as this is the number one way to prevent the spread of infection!  Home Care: Only take medications as instructed by your medical team. Complete the entire course of an antibiotic. Do not take these medications with alcohol. A steam or ultrasonic humidifier can help congestion.  You can place a towel over your head and breathe in the steam from hot water coming from a faucet. Avoid close contacts especially the very young and the elderly. Cover your mouth when you cough or sneeze. Always remember to wash your hands.  Get Help Right Away If: You develop worsening fever or sinus pain. You develop a severe head ache or visual changes. Your symptoms persist after you have completed your treatment plan.  Make sure you Understand these  instructions. Will watch your condition. Will get help right away if you are not doing well or get worse.  Thank you for choosing an e-visit.  Your e-visit answers were reviewed by a board certified advanced clinical practitioner to complete your personal care plan. Depending upon the condition, your plan could have included both over the counter or prescription medications.  Please review your pharmacy choice. Make sure the pharmacy is open so you can pick up prescription now. If there is a problem, you may contact your provider through [employer] and have the prescription routed to another pharmacy.  Your safety is important to [place]. If you have drug allergies check your prescription carefully.   For the next 24 hours you can use MyChart to ask questions about today's visit, request a non-urgent call back, or ask for a work or school excuse. You will get an email in the next two days asking about your experience. I hope that your e-visit has been valuable and will speed your recovery.

## 2021-10-08 NOTE — Progress Notes (Signed)
I have spent 5 minutes in review of e-visit questionnaire, review and updating patient chart, medical decision making and response to patient.   Kalin Amrhein Cody Sarinity Dicicco, PA-C    

## 2022-03-17 ENCOUNTER — Telehealth (INDEPENDENT_AMBULATORY_CARE_PROVIDER_SITE_OTHER): Payer: BC Managed Care – PPO | Admitting: Family Medicine

## 2022-03-17 ENCOUNTER — Encounter: Payer: Self-pay | Admitting: Family Medicine

## 2022-03-17 VITALS — Wt 138.0 lb

## 2022-03-17 DIAGNOSIS — S40261A Insect bite (nonvenomous) of right shoulder, initial encounter: Secondary | ICD-10-CM

## 2022-03-17 DIAGNOSIS — W57XXXA Bitten or stung by nonvenomous insect and other nonvenomous arthropods, initial encounter: Secondary | ICD-10-CM | POA: Diagnosis not present

## 2022-03-17 MED ORDER — TRIAMCINOLONE ACETONIDE 0.1 % EX OINT: 1.0000 | TOPICAL_OINTMENT | Freq: Two times a day (BID) | CUTANEOUS | 1 refills | Status: DC

## 2022-03-17 NOTE — Progress Notes (Signed)
   Virtual Visit via Video   I connected with patient on 03/17/22 at  9:00 AM EDT by a video enabled telemedicine application and verified that I am speaking with the correct person using two identifiers.  Location patient: Home Location provider: [facility], Office Persons participating in the virtual visit: Patient, Provider, [place] Marcille Blanco C)  I discussed the limitations of evaluation and management by telemedicine and the availability of in person appointments. The patient expressed understanding and agreed to proceed.  Subjective:   HPI:   Tick bite- pt reports she got bit by a deer tick yesterday on R shoulder.  Tick was not engorged.  Area is now red and irritated.  + itchy.  No red ring, 'but it looks like a bug bite'.  Not TTP.  No body aches, chills, fever.    ROS:   See pertinent positives and negatives per HPI.  Patient Active Problem List   Diagnosis Date Noted   Hyperlipidemia 07/25/2021   Osteopenia 07/25/2021   Dyspareunia 05/03/2015   Screening for malignant neoplasm of cervix 03/27/2011   Routine general medical examination at a health care facility 03/27/2011   DERMATITIS, ATOPIC 12/24/2007   DYSFUNCTION, EUSTACHIAN TUBE 07/06/2007   ALLERGIC RHINITIS 07/06/2007    Social History   Tobacco Use   Smoking status: Former    Types: Cigarettes    Quit date: 09/29/1982    Years since quitting: 39.4   Smokeless tobacco: Never  Substance Use Topics   Alcohol use: Yes    Alcohol/week: 0.0 standard drinks of alcohol    Comment: wine daily 7-14    Current Outpatient Medications:    OVER THE COUNTER MEDICATION, Take 2 capsules by mouth 2 (two) times daily before a meal. Plexus bio cleanse, Disp: , Rfl:    Probiotic Product (PROBIOTIC DAILY PO), Take 1 capsule by mouth 2 (two) times daily before a meal., Disp: , Rfl:    doxycycline (VIBRA-TABS) 100 MG tablet, Take 1 tablet (100 mg total) by mouth 2 (two) times daily. (Patient not taking: Reported on  03/17/2022), Disp: 20 tablet, Rfl: 0  No Known Allergies  Objective:   Wt 138 lb (62.6 kg)   LMP 06/08/2013   BMI 25.24 kg/m  AAOx3, NAD NCAT, EOMI No obvious CN deficits Coloring WNL Erythematous localized reaction surrounding bite site on R posterior shoulder Pt is able to speak clearly, coherently without shortness of breath or increased work of breathing.  Thought process is linear.  Mood is appropriate.   Assessment and Plan:   Tick Bite- new.  R shoulder.  Pt reports area is itchy and red but not painful.  No target like ring.  Thankfully tick was not engorged at time of removal.  Reviewed that this is likely a localized reaction and not a systemic tick borne illness.  Start topical Triamcinolone.  Reviewed red flags that should prompt immediate evaluation- target like lesion, rapidly spreading redness, body aches/fever/chills, rash.  Pt expressed understanding and is in agreement w/ plan.    Annye Asa, MD 03/17/2022

## 2022-03-24 ENCOUNTER — Encounter: Payer: Self-pay | Admitting: Internal Medicine

## 2022-05-22 ENCOUNTER — Other Ambulatory Visit: Payer: Self-pay | Admitting: Family Medicine

## 2022-05-22 ENCOUNTER — Ambulatory Visit (AMBULATORY_SURGERY_CENTER): Payer: BC Managed Care – PPO | Admitting: *Deleted

## 2022-05-22 VITALS — Ht 62.0 in | Wt 141.0 lb

## 2022-05-22 DIAGNOSIS — Z86010 Personal history of colonic polyps: Secondary | ICD-10-CM

## 2022-05-22 DIAGNOSIS — Z1231 Encounter for screening mammogram for malignant neoplasm of breast: Secondary | ICD-10-CM

## 2022-05-22 MED ORDER — NA SULFATE-K SULFATE-MG SULF 17.5-3.13-1.6 GM/177ML PO SOLN: 1.0000 | Freq: Once | ORAL | 0 refills | Status: AC

## 2022-05-22 NOTE — Progress Notes (Signed)
No egg or soy allergy known to patient  No issues known to pt with past sedation with any surgeries or procedures Patient denies ever being told they had issues or difficulty with intubation  No FH of Malignant Hyperthermia Pt is not on diet pills Pt is not on  home 02  Pt is not on blood thinners  Pt denies issues with constipation  No A fib or A flutter Have any cardiac testing pending--no Pt instructed to use [email or web address] or GoodRx for a price reduction on prep   

## 2022-05-29 ENCOUNTER — Encounter: Payer: Self-pay | Admitting: Internal Medicine

## 2022-06-13 ENCOUNTER — Encounter: Payer: Self-pay | Admitting: Internal Medicine

## 2022-06-13 ENCOUNTER — Ambulatory Visit (AMBULATORY_SURGERY_CENTER): Payer: BC Managed Care – PPO | Admitting: Internal Medicine

## 2022-06-13 VITALS — BP 114/72 | HR 59 | Temp 97.7°F | Resp 18 | Ht 62.0 in | Wt 141.0 lb

## 2022-06-13 DIAGNOSIS — D122 Benign neoplasm of ascending colon: Secondary | ICD-10-CM

## 2022-06-13 DIAGNOSIS — Z09 Encounter for follow-up examination after completed treatment for conditions other than malignant neoplasm: Secondary | ICD-10-CM | POA: Diagnosis present

## 2022-06-13 DIAGNOSIS — Z86010 Personal history of colonic polyps: Secondary | ICD-10-CM

## 2022-06-13 MED ORDER — SODIUM CHLORIDE 0.9 % IV SOLN: 500.0000 mL | Freq: Once | INTRAVENOUS | Status: DC

## 2022-06-13 NOTE — Progress Notes (Signed)
Report to PACU, RN, vss, BBS= Clear.  

## 2022-06-13 NOTE — Progress Notes (Signed)
Called to room to assist during endoscopic procedure.  Patient ID and intended procedure confirmed with present staff. Received instructions for my participation in the procedure from the performing physician.  

## 2022-06-13 NOTE — Op Note (Signed)
[facility] Patient Name: Felicia Ball Procedure Date: 06/13/2022 9:02 AM MRN: [id number] Endoscopist: Docia Chuck. Henrene Pastor , MD Age: 66 Referring MD:  Date of Birth: 26-Jun-1956 Gender: Female Account #: [id number] Procedure:                Colonoscopy with cold snare polypectomy x 1 Indications:              High risk colon cancer surveillance: Personal                            history of non-advanced adenoma. Previous                            examination 2016 Medicines:                Monitored Anesthesia Care Procedure:                Pre-Anesthesia Assessment:                           - Prior to the procedure, a History and Physical                            was performed, and patient medications and                            allergies were reviewed. The patient's tolerance of                            previous anesthesia was also reviewed. The risks                            and benefits of the procedure and the sedation                            options and risks were discussed with the patient.                            All questions were answered, and informed consent                            was obtained. Prior Anticoagulants: The patient has                            taken no previous anticoagulant or antiplatelet                            agents. ASA Grade Assessment: II - A patient with                            mild systemic disease. After reviewing the risks                            and benefits, the patient was deemed in  satisfactory condition to undergo the procedure.                           After obtaining informed consent, the colonoscope                            was passed under direct vision. Throughout the                            procedure, the patient's blood pressure, pulse, and                            oxygen saturations were monitored continuously. The                            CF [id number] #[id number]  was introduced through the anus                            and advanced to the the cecum, identified by                            appendiceal orifice and ileocecal valve. The                            ileocecal valve, appendiceal orifice, and rectum                            were photographed. The quality of the bowel                            preparation was excellent. The colonoscopy was                            performed without difficulty. The patient tolerated                            the procedure well. The bowel preparation used was                            SUPREP via split dose instruction. Scope In: 9:07:21 AM Scope Out: 9:20:25 AM Scope Withdrawal Time: 0 hours 9 minutes 17 seconds  Total Procedure Duration: 0 hours 13 minutes 4 seconds  Findings:                 A 3 mm polyp was found in the ascending colon. The                            polyp was removed with a cold snare. Resection and                            retrieval were complete.                           The exam was otherwise without abnormality on  direct and retroflexion views. Internal hemorrhoids                            present. Complications:            No immediate complications. Estimated blood loss:                            None. Estimated Blood Loss:     Estimated blood loss: none. Impression:               - One 3 mm polyp in the ascending colon, removed                            with a cold snare. Resected and retrieved.                           - The examination was otherwise normal on direct                            and retroflexion views. Recommendation:           - Repeat colonoscopy in 7-10 years for surveillance.                           - Patient has a contact number available for                            emergencies. The signs and symptoms of potential                            delayed complications were discussed with the                             patient. Return to normal activities tomorrow.                            Written discharge instructions were provided to the                            patient.                           - Resume previous diet.                           - Continue present medications.                           - Await pathology results. Docia Chuck. Henrene Pastor, MD 06/13/2022 9:29:10 AM This report has been signed electronically.

## 2022-06-13 NOTE — Progress Notes (Signed)
HISTORY OF PRESENT ILLNESS:  Felicia Ball is a 66 y.o. female with a history of adenomatous colon polyps.  Last examination 2016.  Now for surveillance  REVIEW OF SYSTEMS:  All non-GI ROS negative.  Past Medical History:  Diagnosis Date   Allergic conjunctivitis    Allergic rhinitis    Allergy    seasonal   Cataract    bilateral,removed   Eustachian tube dysfunction     Past Surgical History:  Procedure Laterality Date   BARTHOLIN GLAND CYST EXCISION     cataracts Bilateral    COLONOSCOPY     POLYPECTOMY     TONSILLECTOMY     WISDOM TOOTH EXTRACTION     with sedation    Social History Felicia Ball  reports that she quit smoking about 39 years ago. Her smoking use included cigarettes. She has been exposed to tobacco smoke. She has never used smokeless tobacco. She reports current alcohol use. She reports that she does not use drugs.  family history includes Alzheimer's disease in an other family member; Hypertension in an other family member.  No Known Allergies     PHYSICAL EXAMINATION: Vital signs: BP (!) 142/92   Pulse 74   Temp 97.7 F (36.5 C)   Ht '5\' 2"'$  (1.575 m)   Wt 141 lb (64 kg)   LMP 06/08/2013   SpO2 99%   BMI 25.79 kg/m  General: Well-developed, well-nourished, no acute distress HEENT: Sclerae are anicteric, conjunctiva pink. Oral mucosa intact Lungs: Clear Heart: Regular Abdomen: soft, nontender, nondistended, no obvious ascites, no peritoneal signs, normal bowel sounds. No organomegaly. Extremities: No edema Psychiatric: alert and oriented x3. Cooperative      ASSESSMENT:  History of adenomatous colon polyps   PLAN:  Colonoscopy

## 2022-06-13 NOTE — Progress Notes (Signed)
Pt's states no medical or surgical changes since previsit or office visit. 

## 2022-06-13 NOTE — Patient Instructions (Signed)
1 polyp removed- await pathology  Please read over handout about polyps Please continue your normal medications   YOU HAD AN ENDOSCOPIC PROCEDURE TODAY AT [facility]:   Refer to the procedure report that was given to you for any specific questions about what was found during the examination.  If the procedure report does not answer your questions, please call your gastroenterologist to clarify.  If you requested that your care partner not be given the details of your procedure findings, then the procedure report has been included in a sealed envelope for you to review at your convenience later.  YOU SHOULD EXPECT: Some feelings of bloating in the abdomen. Passage of more gas than usual.  Walking can help get rid of the air that was put into your GI tract during the procedure and reduce the bloating. If you had a lower endoscopy (such as a colonoscopy or flexible sigmoidoscopy) you may notice spotting of blood in your stool or on the toilet paper. If you underwent a bowel prep for your procedure, you may not have a normal bowel movement for a few days.  Please Note:  You might notice some irritation and congestion in your nose or some drainage.  This is from the oxygen used during your procedure.  There is no need for concern and it should clear up in a day or so.  SYMPTOMS TO REPORT IMMEDIATELY:  Following lower endoscopy (colonoscopy or flexible sigmoidoscopy):  Excessive amounts of blood in the stool  Significant tenderness or worsening of abdominal pains  Swelling of the abdomen that is new, acute  Fever of 100F or higher For urgent or emergent issues, a gastroenterologist can be reached at any hour by calling [phone]. Do not use MyChart messaging for urgent concerns.    DIET:  We do recommend a small meal at first, but then you may proceed to your regular diet.  Drink plenty of fluids but you should avoid alcoholic beverages for 24 hours.  ACTIVITY:  You should  plan to take it easy for the rest of today and you should NOT DRIVE or use heavy machinery until tomorrow (because of the sedation medicines used during the test).    FOLLOW UP: Our staff will call the number listed on your records the next business day following your procedure.  We will call around 7:15- 8:00 am to check on you and address any questions or concerns that you may have regarding the information given to you following your procedure. If we do not reach you, we will leave a message.     If any biopsies were taken you will be contacted by phone or by letter within the next 1-3 weeks.  Please call us at [phone] if you have not heard about the biopsies in 3 weeks.    SIGNATURES/CONFIDENTIALITY: You and/or your care partner have signed paperwork which will be entered into your electronic medical record.  These signatures attest to the fact that that the information above on your After Visit Summary has been reviewed and is understood.  Full responsibility of the confidentiality of this discharge information lies with you and/or your care-partner.

## 2022-06-16 ENCOUNTER — Telehealth: Payer: Self-pay

## 2022-06-16 NOTE — Telephone Encounter (Signed)
  Follow up Call-     06/13/2022    7:59 AM  Call back number  Post procedure Call Back phone  # [phone]  Permission to leave phone message Yes    Follow up call, LVM

## 2022-06-17 ENCOUNTER — Ambulatory Visit: Payer: BC Managed Care – PPO

## 2022-06-19 ENCOUNTER — Encounter: Payer: Self-pay | Admitting: Internal Medicine

## 2022-07-09 ENCOUNTER — Ambulatory Visit
Admission: RE | Admit: 2022-07-09 | Discharge: 2022-07-09 | Disposition: A | Discharge status: Home / Self Care | Payer: BC Managed Care – PPO | Source: Ambulatory Visit | Attending: Family Medicine | Admitting: Family Medicine

## 2022-07-09 DIAGNOSIS — Z1231 Encounter for screening mammogram for malignant neoplasm of breast: Secondary | ICD-10-CM

## 2022-07-28 ENCOUNTER — Encounter: Payer: Self-pay | Admitting: Family Medicine

## 2022-07-28 ENCOUNTER — Ambulatory Visit (INDEPENDENT_AMBULATORY_CARE_PROVIDER_SITE_OTHER): Payer: BC Managed Care – PPO | Admitting: Family Medicine

## 2022-07-28 VITALS — BP 136/82 | HR 72 | Temp 98.1°F | Resp 16 | Ht 62.0 in | Wt 139.0 lb

## 2022-07-28 DIAGNOSIS — Z23 Encounter for immunization: Secondary | ICD-10-CM | POA: Diagnosis not present

## 2022-07-28 DIAGNOSIS — Z0000 Encounter for general adult medical examination without abnormal findings: Secondary | ICD-10-CM | POA: Diagnosis not present

## 2022-07-28 DIAGNOSIS — E785 Hyperlipidemia, unspecified: Secondary | ICD-10-CM | POA: Diagnosis not present

## 2022-07-28 LAB — BASIC METABOLIC PANEL
BUN: 20 mg/dL (ref 6–23)
CO2: 27 mEq/L (ref 19–32)
Calcium: 10.1 mg/dL (ref 8.4–10.5)
Chloride: 104 mEq/L (ref 96–112)
Creatinine, Ser: 0.68 mg/dL (ref 0.40–1.20)
GFR: 91.67 mL/min (ref >60.00)
Glucose, Bld: 93 mg/dL (ref 70–99)
Potassium: 4.3 mEq/L (ref 3.5–5.1)
Sodium: 140 mEq/L (ref 135–145)

## 2022-07-28 LAB — CBC WITH DIFFERENTIAL/PLATELET
Basophils Absolute: 0.1 10*3/uL (ref 0.0–0.1)
Basophils Relative: 0.8 % (ref 0.0–3.0)
Eosinophils Absolute: 0.1 10*3/uL (ref 0.0–0.7)
Eosinophils Relative: 1.9 % (ref 0.0–5.0)
HCT: 43.5 % (ref 36.0–46.0)
Hemoglobin: 14.2 g/dL (ref 12.0–15.0)
Lymphocytes Relative: 18.3 % (ref 12.0–46.0)
Lymphs Abs: 1.1 10*3/uL (ref 0.7–4.0)
MCHC: 32.7 g/dL (ref 30.0–36.0)
MCV: 96.7 fl (ref 78.0–100.0)
Monocytes Absolute: 0.4 10*3/uL (ref 0.1–1.0)
Monocytes Relative: 6.8 % (ref 3.0–12.0)
Neutro Abs: 4.4 10*3/uL (ref 1.4–7.7)
Neutrophils Relative %: 72.2 % (ref 43.0–77.0)
Platelets: 244 10*3/uL (ref 150.0–400.0)
RBC: 4.5 Mil/uL (ref 3.87–5.11)
RDW: 14.2 % (ref 11.5–15.5)
WBC: 6.2 10*3/uL (ref 4.0–10.5)

## 2022-07-28 LAB — LIPID PANEL
Cholesterol: 252 mg/dL — ABNORMAL HIGH (ref 0–200)
HDL: 98.9 mg/dL (ref >39.00)
LDL Cholesterol: 135 mg/dL — ABNORMAL HIGH (ref 0–99)
NonHDL: 152.72
Total CHOL/HDL Ratio: 3
Triglycerides: 87 mg/dL (ref 0.0–149.0)
VLDL: 17.4 mg/dL (ref 0.0–40.0)

## 2022-07-28 LAB — HEPATIC FUNCTION PANEL
ALT: 17 U/L (ref 0–35)
AST: 27 U/L (ref 0–37)
Albumin: 4.6 g/dL (ref 3.5–5.2)
Alkaline Phosphatase: 48 U/L (ref 39–117)
Bilirubin, Direct: 0.1 mg/dL (ref 0.0–0.3)
Total Bilirubin: 0.4 mg/dL (ref 0.2–1.2)
Total Protein: 7.1 g/dL (ref 6.0–8.3)

## 2022-07-28 LAB — TSH: TSH: 1.04 u[IU]/mL (ref 0.35–5.50)

## 2022-07-28 NOTE — Patient Instructions (Addendum)
Follow up in 1 year or as needed We'll notify you of your lab results and make any changes if needed Keep up the good work on healthy diet and regular exercise- you look great! Call with any questions or concerns Stay Safe!  Stay Healthy! Happy Birthday!!!

## 2022-07-28 NOTE — Assessment & Plan Note (Signed)
Pt's PE WNL.  UTD on pap, mammo, colonoscopy Tdap.  Flu shot given.  Check labs.  Anticipatory guidance provided.

## 2022-07-28 NOTE — Progress Notes (Signed)
   Subjective:    Patient ID: Felicia Ball, female    DOB: [birth year], 66 y.o.   MRN: [id number]  HPI CPE- UTD on pap, mammo, colonoscopy, Tdap.  Due for flu.  No concerns today.  Patient Care Team    Relationship Specialty Notifications Start End  Midge Minium, MD PCP - General Family Medicine  12/28/20   Irene Shipper, MD Consulting Physician Gastroenterology  07/25/21      Health Maintenance  Topic Date Due   INFLUENZA VACCINE  04/29/2022   Zoster Vaccines- Shingrix (1 of 2) 10/28/2022 (Originally 07/30/2007)   PAP SMEAR-Modifier  04/05/2023   MAMMOGRAM  07/09/2024   COLONOSCOPY (Pts 45-50yr Insurance coverage will need to be confirmed)  06/13/2029   TETANUS/TDAP  07/26/2031   Hepatitis C Screening  Completed   HIV Screening  Completed   HPV VACCINES  Aged Out   COVID-19 Vaccine  Discontinued      Review of Systems Patient reports no vision/ hearing changes, adenopathy,fever, weight change,  persistant/recurrent hoarseness , swallowing issues, chest pain, palpitations, edema, persistant/recurrent cough, hemoptysis, dyspnea (rest/exertional/paroxysmal nocturnal), gastrointestinal bleeding (melena, rectal bleeding), abdominal pain, significant heartburn, bowel changes, GU symptoms (dysuria, hematuria, incontinence), Gyn symptoms (abnormal  bleeding, pain),  syncope, focal weakness, memory loss, numbness & tingling, skin/hair/nail changes, abnormal bruising or bleeding, anxiety, or depression.     Objective:   Physical Exam General Appearance:    Alert, cooperative, no distress, appears stated age  Head:    Normocephalic, without obvious abnormality, atraumatic  Eyes:    PERRL, conjunctiva/corneas clear, EOM's intact both eyes  Ears:    Normal TM's and external ear canals, both ears  Nose:   Nares normal, septum midline, mucosa normal, no drainage    or sinus tenderness  Throat:   Lips, mucosa, and tongue normal; teeth and gums normal  Neck:   Supple, symmetrical,  trachea midline, no adenopathy;    Thyroid: no enlargement/tenderness/nodules  Back:     Symmetric, no curvature, ROM normal, no CVA tenderness  Lungs:     Clear to auscultation bilaterally, respirations unlabored  Chest Wall:    No tenderness or deformity   Heart:    Regular rate and rhythm, S1 and S2 normal, no murmur, rub   or gallop  Breast Exam:    Deferred to mammo  Abdomen:     Soft, non-tender, bowel sounds active all four quadrants,    no masses, no organomegaly  Genitalia:    Deferred  Rectal:    Extremities:   Extremities normal, atraumatic, no cyanosis or edema  Pulses:   2+ and symmetric all extremities  Skin:   Skin color, texture, turgor normal, no rashes or lesions  Lymph nodes:   Cervical, supraclavicular, and axillary nodes normal  Neurologic:   CNII-XII intact, normal strength, sensation and reflexes    throughout          Assessment & Plan:

## 2022-07-29 NOTE — Progress Notes (Signed)
Pt seen results Via my chart  

## 2023-08-05 ENCOUNTER — Encounter: Payer: BC Managed Care – PPO | Admitting: Family Medicine

## 2023-09-25 ENCOUNTER — Encounter: Payer: BC Managed Care – PPO | Admitting: Family Medicine

## 2023-10-02 ENCOUNTER — Encounter: Payer: Self-pay | Admitting: Family Medicine

## 2023-10-02 ENCOUNTER — Ambulatory Visit (INDEPENDENT_AMBULATORY_CARE_PROVIDER_SITE_OTHER): Payer: 59 | Admitting: Family Medicine

## 2023-10-02 VITALS — BP 128/80 | HR 77 | Temp 97.8°F | Ht 62.0 in | Wt 141.1 lb

## 2023-10-02 DIAGNOSIS — E785 Hyperlipidemia, unspecified: Secondary | ICD-10-CM

## 2023-10-02 DIAGNOSIS — Z780 Asymptomatic menopausal state: Secondary | ICD-10-CM

## 2023-10-02 DIAGNOSIS — Z0000 Encounter for general adult medical examination without abnormal findings: Secondary | ICD-10-CM | POA: Diagnosis not present

## 2023-10-02 LAB — CBC WITH DIFFERENTIAL/PLATELET
Basophils Absolute: 0 10*3/uL (ref 0.0–0.1)
Basophils Relative: 0.6 % (ref 0.0–3.0)
Eosinophils Absolute: 0.1 10*3/uL (ref 0.0–0.7)
Eosinophils Relative: 1.2 % (ref 0.0–5.0)
HCT: 43.4 % (ref 36.0–46.0)
Hemoglobin: 14.6 g/dL (ref 12.0–15.0)
Lymphocytes Relative: 23.4 % (ref 12.0–46.0)
Lymphs Abs: 1.5 10*3/uL (ref 0.7–4.0)
MCHC: 33.7 g/dL (ref 30.0–36.0)
MCV: 95.9 fL (ref 78.0–100.0)
Monocytes Absolute: 0.4 10*3/uL (ref 0.1–1.0)
Monocytes Relative: 6.9 % (ref 3.0–12.0)
Neutro Abs: 4.3 10*3/uL (ref 1.4–7.7)
Neutrophils Relative %: 67.9 % (ref 43.0–77.0)
Platelets: 274 10*3/uL (ref 150.0–400.0)
RBC: 4.52 Mil/uL (ref 3.87–5.11)
RDW: 14.2 % (ref 11.5–15.5)
WBC: 6.3 10*3/uL (ref 4.0–10.5)

## 2023-10-02 LAB — BASIC METABOLIC PANEL
BUN: 17 mg/dL (ref 6–23)
CO2: 27 meq/L (ref 19–32)
Calcium: 10.3 mg/dL (ref 8.4–10.5)
Chloride: 102 meq/L (ref 96–112)
Creatinine, Ser: 0.74 mg/dL (ref 0.40–1.20)
GFR: 84.45 mL/min (ref >60.00)
Glucose, Bld: 100 mg/dL — ABNORMAL HIGH (ref 70–99)
Potassium: 4 meq/L (ref 3.5–5.1)
Sodium: 139 meq/L (ref 135–145)

## 2023-10-02 LAB — LIPID PANEL
Cholesterol: 264 mg/dL — ABNORMAL HIGH (ref 0–200)
HDL: 117.3 mg/dL (ref >39.00)
LDL Cholesterol: 131 mg/dL — ABNORMAL HIGH (ref 0–99)
NonHDL: 147.14
Total CHOL/HDL Ratio: 2
Triglycerides: 82 mg/dL (ref 0.0–149.0)
VLDL: 16.4 mg/dL (ref 0.0–40.0)

## 2023-10-02 LAB — HEPATIC FUNCTION PANEL
ALT: 18 U/L (ref 0–35)
AST: 29 U/L (ref 0–37)
Albumin: 4.7 g/dL (ref 3.5–5.2)
Alkaline Phosphatase: 61 U/L (ref 39–117)
Bilirubin, Direct: 0.1 mg/dL (ref 0.0–0.3)
Total Bilirubin: 0.6 mg/dL (ref 0.2–1.2)
Total Protein: 7.5 g/dL (ref 6.0–8.3)

## 2023-10-02 LAB — TSH: TSH: 1.49 u[IU]/mL (ref 0.35–5.50)

## 2023-10-02 NOTE — Progress Notes (Signed)
   Subjective:    Patient ID: Felicia Ball, female    DOB: [birth year], 68 y.o.   MRN: [id number]  HPI CPE- UTD on mammo, colonoscopy, Tdap.  Had flu at [facility].  Declines shingles and PNA.    Patient Care Team    Relationship Specialty Notifications Start End  Lucetta Baehr E, MD PCP - General Family Medicine  12/28/20   Abran Norleen SAILOR, MD Consulting Physician Gastroenterology  07/25/21      Health Maintenance  Topic Date Due  . Zoster Vaccines- Shingrix (1 of 2) Never done  . Pneumonia Vaccine 50+ Years old (1 of 1 - PCV) Never done  . DEXA SCAN  Never done  . INFLUENZA VACCINE  04/30/2023  . MAMMOGRAM  07/09/2024  . Colonoscopy  06/13/2029  . DTaP/Tdap/Td (5 - Td or Tdap) 07/26/2031  . Hepatitis C Screening  Completed  . HPV VACCINES  Aged Out  . COVID-19 Vaccine  Discontinued      Review of Systems Patient reports no vision/ hearing changes, adenopathy,fever, weight change,  persistant/recurrent hoarseness , swallowing issues, chest pain, palpitations, edema, persistant/recurrent cough, hemoptysis, dyspnea (rest/exertional/paroxysmal nocturnal), gastrointestinal bleeding (melena, rectal bleeding), abdominal pain, significant heartburn, bowel changes, GU symptoms (dysuria, hematuria, incontinence), Gyn symptoms (abnormal  bleeding, pain),  syncope, focal weakness, memory loss, numbness & tingling, skin/hair/nail changes, abnormal bruising or bleeding, anxiety, or depression.     Objective:   Physical Exam General Appearance:    Alert, cooperative, no distress, appears stated age  Head:    Normocephalic, without obvious abnormality, atraumatic  Eyes:    PERRL, conjunctiva/corneas clear, EOM's intact both eyes  Ears:    Normal TM's and external ear canals, both ears  Nose:   Nares normal, septum midline, mucosa normal, no drainage    or sinus tenderness  Throat:   Lips, mucosa, and tongue normal; teeth and gums normal  Neck:   Supple, symmetrical, trachea midline, no  adenopathy;    Thyroid : no enlargement/tenderness/nodules  Back:     Symmetric, no curvature, ROM normal, no CVA tenderness  Lungs:     Clear to auscultation bilaterally, respirations unlabored  Chest Wall:    No tenderness or deformity   Heart:    Regular rate and rhythm, S1 and S2 normal, no murmur, rub   or gallop  Breast Exam:    Deferred to GYN  Abdomen:     Soft, non-tender, bowel sounds active all four quadrants,    no masses, no organomegaly  Genitalia:    Deferred to GYN  Rectal:    Extremities:   Extremities normal, atraumatic, no cyanosis or edema  Pulses:   2+ and symmetric all extremities  Skin:   Skin color, texture, turgor normal, no rashes or lesions  Lymph nodes:   Cervical, supraclavicular, and axillary nodes normal  Neurologic:   CNII-XII intact, normal strength, sensation and reflexes    throughout          Assessment & Plan:

## 2023-10-02 NOTE — Patient Instructions (Signed)
 Follow up in 1 year or as needed We'll notify you of your lab results and make any changes if needed Keep up the good work on healthy diet and regular exercise- you look great! Call with any questions or concerns Stay Safe!  Stay Healthy! Happy New Year!!

## 2023-10-02 NOTE — Assessment & Plan Note (Signed)
 Pt's PE WNL.  UTD on mammo, colonoscopy, Tdap.  Flu shot at [facility] earlier this fall.  Declines flu and PNA.  Check labs.  Anticipatory guidance provided.

## 2023-10-12 ENCOUNTER — Ambulatory Visit (HOSPITAL_BASED_OUTPATIENT_CLINIC_OR_DEPARTMENT_OTHER)
Admission: RE | Admit: 2023-10-12 | Discharge: 2023-10-12 | Disposition: A | Discharge status: Home / Self Care | Payer: 59 | Source: Ambulatory Visit | Attending: Family Medicine | Admitting: Family Medicine

## 2023-10-12 ENCOUNTER — Telehealth: Payer: Self-pay

## 2023-10-12 DIAGNOSIS — Z780 Asymptomatic menopausal state: Secondary | ICD-10-CM | POA: Insufficient documentation

## 2023-10-12 NOTE — Telephone Encounter (Signed)
 Typically the goal is 2000 units Vit D daily and 1200 Calcium daily

## 2023-10-12 NOTE — Telephone Encounter (Signed)
 Pt has been notifed

## 2023-10-12 NOTE — Telephone Encounter (Signed)
-----   Message from Neena Rhymes sent at 10/12/2023  4:16 PM EST ----- Your bone density shows osteopenia (thinning bone but not as severe as osteoporosis).  At this time, we need to continue Calcium and Vit D supplements daily.

## 2023-11-20 ENCOUNTER — Encounter: Payer: Self-pay | Admitting: Family Medicine

## 2023-11-20 ENCOUNTER — Ambulatory Visit: Payer: 59 | Admitting: Family Medicine

## 2023-11-20 VITALS — BP 126/78 | HR 85 | Temp 97.7°F | Ht 62.0 in | Wt 137.4 lb

## 2023-11-20 DIAGNOSIS — R058 Other specified cough: Secondary | ICD-10-CM

## 2023-11-20 DIAGNOSIS — M67432 Ganglion, left wrist: Secondary | ICD-10-CM | POA: Diagnosis not present

## 2023-11-20 MED ORDER — PREDNISONE 10 MG PO TABS
ORAL_TABLET | ORAL | 0 refills | Status: AC
Start: 2023-11-20 — End: ?

## 2023-11-20 NOTE — Progress Notes (Signed)
   Subjective:    Patient ID: Felicia Ball, female    DOB: 1956-05-20, 68 y.o.   MRN: [id number]  HPI Wrist pain- L sided.  Pt reports there has always been a bump present but now it's swollen and painful.  No redness or warmth.  She reports bump is more 'squishy' than previous.  Now painful.  Improves w/ tylenol/ibuprofen.  Non dominant wrist.  URI- pt reports sxs started a couple of weeks ago.  Continues to have airway inflammation that results in coughing w/ laughing or talking.  Cough is intermittently productive.  Has some blood when blowing nose.  Review of Systems For ROS see HPI     Objective:   Physical Exam Vitals reviewed.  Constitutional:      General: She is not in acute distress.    Appearance: Normal appearance. She is well-developed. She is not ill-appearing.  HENT:     Head: Normocephalic and atraumatic.     Right Ear: Tympanic membrane and ear canal normal.     Left Ear: Tympanic membrane and ear canal normal.     Nose: No congestion or rhinorrhea.     Comments: No TTP over frontal or maxillary sinuses Eyes:     Conjunctiva/sclera: Conjunctivae normal.     Pupils: Pupils are equal, round, and reactive to light.  Cardiovascular:     Rate and Rhythm: Normal rate and regular rhythm.     Heart sounds: Normal heart sounds. No murmur heard. Pulmonary:     Effort: Pulmonary effort is normal. No respiratory distress.     Breath sounds: Normal breath sounds. No wheezing or rhonchi.     Comments: + hacking cough Musculoskeletal:        General: Swelling (proximal to ganglion cyst in L wrist), tenderness (mild TTP over swelling extending proximally from L ganglion) and deformity (soft tissue mass consistent w/ ganglion cyst over L 1st CMC joint) present.     Cervical back: Normal range of motion and neck supple.  Lymphadenopathy:     Cervical: No cervical adenopathy.  Skin:    General: Skin is warm and dry.  Neurological:     General: No focal deficit present.      Mental Status: She is alert and oriented to person, place, and time.  Psychiatric:        Mood and Affect: Mood normal.        Behavior: Behavior normal.        Thought Content: Thought content normal.           Assessment & Plan:   Ganglion cyst- new.  L wrist.  She reports area was previously more firm but now seems 'squishy'.  She also has a swollen area proximal to the cyst that was not there until recently.  This is the area that is painful.  Reviewed dx w/ pt.  Discussed referral to hand specialist but she would like to see if things improve w/ Prednisone first.  Post Viral cough- new.  Pt has had sxs for multiple weeks.  Other viral sxs have resolved but continues to have hacking cough w/ laughing or prolonged talking.  This is consistent w/ airway inflammation.  Will start Prednisone taper as above.  Pt expressed understanding and is in agreement w/ plan.

## 2023-11-20 NOTE — Patient Instructions (Signed)
Follow up as needed or as scheduled START the Prednisone as directed- 3 pills at the same time x3 days, then 2 pills at the same time x3 days, then 1 pill daily.  Take w/ food  ICE your wrist Call with any questions or concerns Stay Safe!  Stay Healthy!

## 2024-03-17 ENCOUNTER — Encounter: Payer: Self-pay | Admitting: Family Medicine

## 2024-03-23 ENCOUNTER — Ambulatory Visit: Admitting: Family Medicine

## 2024-03-29 ENCOUNTER — Ambulatory Visit: Admitting: Family Medicine

## 2024-03-30 ENCOUNTER — Other Ambulatory Visit: Payer: Self-pay | Admitting: Family Medicine

## 2024-03-30 DIAGNOSIS — Z1231 Encounter for screening mammogram for malignant neoplasm of breast: Secondary | ICD-10-CM

## 2024-04-26 ENCOUNTER — Ambulatory Visit
Admission: RE | Admit: 2024-04-26 | Discharge: 2024-04-26 | Disposition: A | Discharge status: Home / Self Care | Source: Ambulatory Visit

## 2024-04-26 DIAGNOSIS — Z1231 Encounter for screening mammogram for malignant neoplasm of breast: Secondary | ICD-10-CM
